# Patient Record
Sex: Female | Born: 1970 | Race: Black or African American | Hispanic: No | Marital: Single
Health system: Southern US, Community
[De-identification: ages and names within clinical notes are randomized; demographics above are authoritative.]

## PROBLEM LIST (undated history)

## (undated) DIAGNOSIS — E7800 Pure hypercholesterolemia, unspecified: Secondary | ICD-10-CM

## (undated) DIAGNOSIS — I10 Essential (primary) hypertension: Secondary | ICD-10-CM

## (undated) DIAGNOSIS — K5900 Constipation, unspecified: Secondary | ICD-10-CM

## (undated) HISTORY — PX: OOPHORECTOMY: SHX86

## (undated) HISTORY — PX: ABDOMINAL HYSTERECTOMY: SHX81

## (undated) HISTORY — PX: CHOLECYSTECTOMY: SHX55

## (undated) HISTORY — PX: ABDOMINAL SURGERY: SHX537

## (undated) HISTORY — DX: Constipation, unspecified: K59.00

## (undated) HISTORY — PX: BONE MARROW BIOPSY: SHX199

## (undated) HISTORY — PX: LAPAROSCOPIC OOPHERECTOMY: SHX6507

---

## 2002-04-24 ENCOUNTER — Encounter: Payer: Self-pay | Admitting: Internal Medicine

## 2002-04-24 ENCOUNTER — Ambulatory Visit (HOSPITAL_COMMUNITY): Admission: RE | Admit: 2002-04-24 | Discharge: 2002-04-24 | Payer: Self-pay | Admitting: Internal Medicine

## 2003-10-29 ENCOUNTER — Ambulatory Visit (HOSPITAL_COMMUNITY): Admission: RE | Admit: 2003-10-29 | Discharge: 2003-10-29 | Payer: Self-pay | Admitting: Internal Medicine

## 2003-11-05 ENCOUNTER — Ambulatory Visit (HOSPITAL_COMMUNITY): Admission: RE | Admit: 2003-11-05 | Discharge: 2003-11-05 | Payer: Self-pay | Admitting: Internal Medicine

## 2003-11-12 ENCOUNTER — Ambulatory Visit (HOSPITAL_COMMUNITY): Admission: RE | Admit: 2003-11-12 | Discharge: 2003-11-12 | Payer: Self-pay | Admitting: General Surgery

## 2004-06-02 ENCOUNTER — Ambulatory Visit (HOSPITAL_COMMUNITY): Admission: RE | Admit: 2004-06-02 | Discharge: 2004-06-02 | Payer: Self-pay | Admitting: General Surgery

## 2004-07-19 ENCOUNTER — Encounter: Admission: RE | Admit: 2004-07-19 | Discharge: 2004-07-19 | Payer: Self-pay | Admitting: Emergency Medicine

## 2004-10-04 ENCOUNTER — Encounter: Admission: RE | Admit: 2004-10-04 | Discharge: 2004-10-04 | Payer: Self-pay | Admitting: Emergency Medicine

## 2004-10-13 ENCOUNTER — Ambulatory Visit: Payer: Self-pay | Admitting: Hematology and Oncology

## 2004-10-21 ENCOUNTER — Other Ambulatory Visit: Admission: RE | Admit: 2004-10-21 | Discharge: 2004-10-21 | Payer: Self-pay | Admitting: Hematology and Oncology

## 2004-10-21 ENCOUNTER — Encounter (INDEPENDENT_AMBULATORY_CARE_PROVIDER_SITE_OTHER): Payer: Self-pay | Admitting: *Deleted

## 2004-11-30 ENCOUNTER — Ambulatory Visit: Payer: Self-pay | Admitting: Hematology and Oncology

## 2004-12-27 ENCOUNTER — Ambulatory Visit: Payer: Self-pay | Admitting: Gastroenterology

## 2004-12-28 ENCOUNTER — Encounter (INDEPENDENT_AMBULATORY_CARE_PROVIDER_SITE_OTHER): Payer: Self-pay | Admitting: *Deleted

## 2004-12-28 ENCOUNTER — Ambulatory Visit: Payer: Self-pay | Admitting: Gastroenterology

## 2005-01-20 ENCOUNTER — Ambulatory Visit: Payer: Self-pay | Admitting: Gastroenterology

## 2005-01-22 ENCOUNTER — Ambulatory Visit: Payer: Self-pay | Admitting: Hematology and Oncology

## 2005-01-27 ENCOUNTER — Ambulatory Visit (HOSPITAL_COMMUNITY): Admission: RE | Admit: 2005-01-27 | Discharge: 2005-01-27 | Payer: Self-pay | Admitting: Gastroenterology

## 2005-02-02 ENCOUNTER — Ambulatory Visit: Payer: Self-pay | Admitting: Gastroenterology

## 2005-03-30 ENCOUNTER — Other Ambulatory Visit: Admission: RE | Admit: 2005-03-30 | Discharge: 2005-03-30 | Payer: Self-pay | Admitting: Gynecology

## 2005-04-27 ENCOUNTER — Ambulatory Visit: Payer: Self-pay | Admitting: Gastroenterology

## 2006-04-26 ENCOUNTER — Other Ambulatory Visit: Admission: RE | Admit: 2006-04-26 | Discharge: 2006-04-26 | Payer: Self-pay | Admitting: Gynecology

## 2007-05-02 ENCOUNTER — Other Ambulatory Visit: Admission: RE | Admit: 2007-05-02 | Discharge: 2007-05-02 | Payer: Self-pay | Admitting: Gynecology

## 2007-12-10 ENCOUNTER — Ambulatory Visit (HOSPITAL_COMMUNITY): Admission: RE | Admit: 2007-12-10 | Discharge: 2007-12-10 | Payer: Self-pay | Admitting: Obstetrics and Gynecology

## 2007-12-10 ENCOUNTER — Encounter (INDEPENDENT_AMBULATORY_CARE_PROVIDER_SITE_OTHER): Payer: Self-pay | Admitting: Obstetrics and Gynecology

## 2008-08-21 ENCOUNTER — Encounter: Admission: RE | Admit: 2008-08-21 | Discharge: 2008-08-21 | Payer: Self-pay | Admitting: Gynecology

## 2010-07-19 NOTE — Op Note (Signed)
Latoya Williams, Latoya Williams NO.:  [id number]   MEDICAL RECORD NO.:  [id number]          PATIENT TYPE:  AMB   LOCATION:  SDC                           FACILITY:  [facility]   PHYSICIAN:  Randye Lobo, M.D.   DATE OF BIRTH:  1970/03/24   DATE OF PROCEDURE:  12/10/2007  DATE OF DISCHARGE:                               OPERATIVE REPORT   PREOPERATIVE DIAGNOSIS:  Chronic right lower quadrant pain.   POSTOPERATIVE DIAGNOSES:  1. Chronic right lower quadrant pain.  2. Pelvic and abdominal adhesions.   PROCEDURE:  Laparoscopic right salpingo-oophorectomy and lysis of pelvic  and abdominal adhesions.   SURGEON:  Randye Lobo, MD   ASSISTANT:  Dollene Primrose. Edward Jolly, [facility]   ANESTHESIA:  General endotracheal.   IV FLUIDS:  1500 mL Ringer lactate.   ESTIMATED BLOOD LOSS:  Minimal.   URINE OUTPUT:  300 mL.   COMPLICATIONS:  None.   INDICATIONS FOR PROCEDURE:  The patient is a 40 year old gravida 2, para  2 African American female with right lower quadrant pain with ovulation  occurring for the last 2 years.  The patient reports that the pain is  debilitating and can last for up to 2 weeks at a time.  The patient has  had a history of a prior total vaginal hysterectomy for abnormal uterine  bleeding in the year 2001.  The patient has had a pelvic ultrasound on  October 04, 2007, documenting bilateral ovaries with multiple small simple  cysts, the largest of which was a 1.1-cm cyst in the right ovary.  There  was no evidence of any free fluid.  The patient did not have any  improvement of her pain with a trial of oral contraceptive therapy.  The  patient is status post colonoscopy, and CT scan of the abdomen and  pelvis, which were unremarkable.  The patient does have a remote history  of a laparoscopic cholecystectomy for a gangrenous gallbladder in 1993.  The patient now presents for surgical evaluation and treatment of her  right lower quadrant pain, and she requests removal of the  right tube  and ovary.  The plan is now made to proceed with a laparoscopic right  salpingo-oophorectomy with anticipated lysis of adhesions after risks,  benefits, and alternatives are reviewed.  The patient understands that  removal of the right tube and ovary is not a guarantee for resolution of  her pain, and she wishes to proceed and is quite insistant about this.   FINDINGS:  Laparoscopy demonstrated adhesive disease of the abdomen and  pelvis.  Specifically, there were adhesions along the left pelvic side  wall.  The left tube and ovary were adherent to the left pelvic side  wall and the rectosigmoid colon was also attached to the left tube and  ovary.  The right tube and ovary were unremarkable expect for the fact  that the right ovary had several small cystic areas.  There was  extensive adhesive disease in the right upper quadrant between the liver  and the abdominal wall and also omentum and the abdominal wall.  These  ended  at the level of the umbilicus.  The appendix was unremarkable.  There was no evidence of any endometriosis in the abdomen or pelvis.  The uterus was surgically absent.   SPECIMENS:  The right tube and ovary were sent to pathology.   PROCEDURE:  The patient was reidentified in the preoperative hold area.  She did receive clindamycin 900 mg IV for antibiotic prophylaxis.  The  patient received TED hose for DVT prophylaxis.   In the operating room, general endotracheal anesthesia was induced, and  the patient was then placed in the dorsal lithotomy position.  The  abdomen and vagina were sterilely prepped and draped.  A Foley catheter  was sterilely placed inside the bladder.  A ring forceps with two  counted and marked sponges was placed in the vagina.   The procedure began by creating a 1-cm umbilical incision with a  scalpel.  Dissection down to the fascia occurred with an Allis clamp.  A  10-mm trocar was then inserted directly into the peritoneal  cavity  without difficulty and the laparoscope confirmed proper placement.  A  CO2 pneumoperitoneum was achieved and the patient was then placed in the  Trendelenburg position.  A 5-mm right and left lower quadrant incisions  were then created with a scalpel and 5-mm trocars were placed through  each of these incisions under direct visualization of the laparoscope.  An inspection of the pelvic and abdominal organs was performed and the  findings were as noted above.   The adhesions along the left pelvic side wall between the rectosigmoid  colon and the left tube and ovary were first addressed by using a  laparoscopic scissors.  Hemostasis was created with the Gyrus  instrument, where necessary.  As the patient was not having any pain on  the left-hand side, no extensive lysis of adhesions of the left tube and  ovary were performed beyond this.   Attention was then turned to the right adnexa region.  The right ureter  was identified along the pelvic side wall.  The tube and ovary were then  elevated, and the Gyrus instrument was used to triple cauterize and then  cut the infundibulopelvic ligament.  Dissection continued through the  broad ligament on that ipsilateral side again using the Gyrus instrument  for cautery and cutting.  The specimen was then completely freed at this  time and was placed in the posterior cul-de-sac.   The 10-mm laparoscope was then converted over to a 5-mm laparoscope,  which was placed through the left lower quadrant incision.  The  EndoCatch bag was placed through the umbilical trocar, and the specimen  was placed in the bag and then removed through the umbilical incision.  This specimen was morcellated to remove it from within the EndoCatch  bag.   The 10-mm trocar was then placed again in the umbilical trocar.  Attention was given to the adhesions, which began at the level of the  umbilicus in the right mid quadrant.  A portion of these adhesions were   lysed as there was a small amount of bleeding along the omentum in this  area, which appeared to have occurred as the umbilical trocar and  laparoscope were examining some of the upper abdominal adhesions.  There  was no extensive bleeding and this area was cauterized easily.  A small  portion of the adhesions were taken down to assure that any cause of the  right lower quadrant pain was relieved.  Hemostasis was  good.   The pelvis was irrigated and suctioned.  There was a small amount of  oozing high along the left pelvic side wall where the rectosigmoid colon  had been previously attached, and this was cauterized briefly with the  Gyrus instrument after it was clear that there were no important  underlying structures in the area.   A small amount of peritoneum overlying the bladder mucosa on the right  hand side was similarly cauterized to what appeared to be oozing  slightly.   Hemostasis was then excellent and the case was concluded.  The 5-mm  lower abdominal trocars were removed under visualization of laparoscope.  The pneumoperitoneum was released and the 10-mm umbilical trocar and  laparoscope were removed simultaneously.   The skin incisions were closed with subcuticular sutures of 3-0 plain-  gut suture.  A simple through-and-through suture was also placed in the  right lower quadrant incision to provide good hemostasis.  Dermabond was  then placed over all the incisions.   The ring forceps and sponges were removed from the vagina and the Foley  catheter was removed as well.   This concluded the patient's procedure.  There were no complications.  Needle, instrument, and sponge counts were correct.  The patient was  discarded to recovery room in stable and awake condition.      Randye Lobo, M.D.  Electronically Signed     BES/MEDQ  D:  12/10/2007  T:  12/11/2007  Job:  [id number]   cc:   Leatha Gilding. Mezer, M.D.  Fax: [phone]

## 2010-07-22 NOTE — H&P (Signed)
NAME:  Latoya Williams, Latoya Williams NO.:  [id number]   MEDICAL RECORD NO.:  [id number]                   PATIENT TYPE:   LOCATION:                                       FACILITY:  [facility]   PHYSICIAN:  Dalia Heading, M.D.               DATE OF BIRTH:  December 19, 1970   DATE OF ADMISSION:  DATE OF DISCHARGE:                                HISTORY & PHYSICAL   CHIEF COMPLAINT:  Intra-abdominal mass.   HISTORY OF PRESENT ILLNESS:  The patient is a 40 year old black female who  is referred for endoscopic evaluation.  She needs a colonoscopy for a  question of an intra-abdominal mass. This was found on CT scan of the  abdomen.  She was found to have a calcified mass along the ascending colon  which was approximately 2 cm in its greatest diameter.  There is a question  if this extrinsic in nature or a diverticulum from the ascending colon.  No  abdominal pain, weight loss, nausea, vomiting, diarrhea, constipation,  melena, or hematochezia have been noted.  She has never had a colonoscopy.  There is no family history of colon carcinoma.   PAST MEDICAL HISTORY:  Unremarkable except for mild anemia.   PAST SURGICAL HISTORY:  Cholelithiasis, partial hysterectomy.   CURRENT MEDICATIONS:  Iron supplements and Nexium.   ALLERGIES:  PENICILLIN.   REVIEW OF SYSTEMS:  Noncontributory.   PHYSICAL EXAMINATION:  GENERAL:  On physical examination, the patient is a  well-developed well-nourished black female in no acute distress.  VITAL SIGNS:  She is afebrile and vital signs are stable.  LUNGS:  Clear to auscultation with equal breath sounds bilaterally.  HEART:  Heart examination reveals a regular rate and rhythm without S3, S4,  or murmurs.  ABDOMEN:  The abdomen is soft, nontender, nondistended.  No  hepatosplenomegaly or masses are noted.  RECTAL:  Examination was deferred to the procedure.   IMPRESSION:  Intra-abdominal mass.   PLAN:  The patient is scheduled for a  colonoscopy on 11/12/2003.  The risks  and benefits of the procedure including bleeding and perforation were fully  explained to the patient, who gave informed consent.  She is getting an  ultrasound of her pelvis later today.     ___________________________________________                                         Dalia Heading, M.D.   MAJ/MEDQ  D:  11/05/2003  T:  11/05/2003  Job:  [id number]   cc:   Dalia Heading, M.D.  [address], [place]  [place] [address]  Fax: [phone]   Madelin Rear. Sherwood Gambler, M.D.  [address]  [place]  [place] [address]  Fax: [phone]

## 2010-12-06 LAB — CBC
HCT: 37.6
Hemoglobin: 12.4
MCV: 84
Platelets: 336
RBC: 4.48
RDW: 13.2

## 2010-12-06 LAB — URINALYSIS, ROUTINE W REFLEX MICROSCOPIC
Hgb urine dipstick: NEGATIVE
Nitrite: NEGATIVE
Urobilinogen, UA: 0.2

## 2014-11-23 ENCOUNTER — Other Ambulatory Visit: Payer: Self-pay | Admitting: Gynecology

## 2014-11-24 LAB — CYTOLOGY - PAP

## 2014-12-10 ENCOUNTER — Encounter: Payer: Self-pay | Admitting: Gastroenterology

## 2015-02-04 ENCOUNTER — Ambulatory Visit: Payer: Self-pay | Admitting: Gastroenterology

## 2015-06-14 ENCOUNTER — Other Ambulatory Visit: Payer: Self-pay | Admitting: Obstetrics and Gynecology

## 2015-06-14 DIAGNOSIS — N644 Mastodynia: Secondary | ICD-10-CM

## 2015-06-17 ENCOUNTER — Other Ambulatory Visit: Payer: Self-pay

## 2015-06-18 ENCOUNTER — Ambulatory Visit
Admission: RE | Admit: 2015-06-18 | Discharge: 2015-06-18 | Disposition: A | Discharge status: Home / Self Care | Payer: BLUE CROSS/BLUE SHIELD | Source: Ambulatory Visit | Attending: Obstetrics and Gynecology | Admitting: Obstetrics and Gynecology

## 2015-06-18 DIAGNOSIS — N644 Mastodynia: Secondary | ICD-10-CM

## 2016-02-14 ENCOUNTER — Emergency Department (HOSPITAL_COMMUNITY): Payer: Commercial Managed Care - HMO

## 2016-02-14 ENCOUNTER — Emergency Department (HOSPITAL_COMMUNITY)
Admission: EM | Admit: 2016-02-14 | Discharge: 2016-02-15 | Disposition: A | Discharge status: Home / Self Care | Payer: Commercial Managed Care - HMO | Attending: Emergency Medicine | Admitting: Emergency Medicine

## 2016-02-14 ENCOUNTER — Encounter (HOSPITAL_COMMUNITY): Payer: Self-pay | Admitting: Emergency Medicine

## 2016-02-14 DIAGNOSIS — R112 Nausea with vomiting, unspecified: Secondary | ICD-10-CM | POA: Diagnosis not present

## 2016-02-14 DIAGNOSIS — R1031 Right lower quadrant pain: Secondary | ICD-10-CM | POA: Insufficient documentation

## 2016-02-14 DIAGNOSIS — I10 Essential (primary) hypertension: Secondary | ICD-10-CM | POA: Insufficient documentation

## 2016-02-14 DIAGNOSIS — R197 Diarrhea, unspecified: Secondary | ICD-10-CM | POA: Insufficient documentation

## 2016-02-14 DIAGNOSIS — Z79899 Other long term (current) drug therapy: Secondary | ICD-10-CM | POA: Insufficient documentation

## 2016-02-14 DIAGNOSIS — R1084 Generalized abdominal pain: Secondary | ICD-10-CM

## 2016-02-14 HISTORY — DX: Essential (primary) hypertension: I10

## 2016-02-14 HISTORY — DX: Pure hypercholesterolemia, unspecified: E78.00

## 2016-02-14 LAB — LIPASE, BLOOD: LIPASE: 17 U/L (ref 11–51)

## 2016-02-14 LAB — CBC
HCT: 39.4 % (ref 36.0–46.0)
HEMOGLOBIN: 12.9 g/dL (ref 12.0–15.0)
MCH: 27.2 pg (ref 26.0–34.0)
MCHC: 32.7 g/dL (ref 30.0–36.0)
MCV: 83.1 fL (ref 78.0–100.0)
Platelets: 337 10*3/uL (ref 150–400)
RBC: 4.74 MIL/uL (ref 3.87–5.11)
RDW: 13.1 % (ref 11.5–15.5)
WBC: 10.2 10*3/uL (ref 4.0–10.5)

## 2016-02-14 LAB — COMPREHENSIVE METABOLIC PANEL
ALBUMIN: 3.6 g/dL (ref 3.5–5.0)
ALT: 13 U/L — AB (ref 14–54)
ANION GAP: 8 (ref 5–15)
AST: 18 U/L (ref 15–41)
Alkaline Phosphatase: 94 U/L (ref 38–126)
BUN: 12 mg/dL (ref 6–20)
CHLORIDE: 106 mmol/L (ref 101–111)
CO2: 24 mmol/L (ref 22–32)
CREATININE: 0.68 mg/dL (ref 0.44–1.00)
Calcium: 8.8 mg/dL — ABNORMAL LOW (ref 8.9–10.3)
GFR calc non Af Amer: >60 mL/min (ref >60)
GLUCOSE: 114 mg/dL — AB (ref 65–99)
Potassium: 3.1 mmol/L — ABNORMAL LOW (ref 3.5–5.1)
SODIUM: 138 mmol/L (ref 135–145)
Total Bilirubin: 0.7 mg/dL (ref 0.3–1.2)
Total Protein: 7.8 g/dL (ref 6.5–8.1)

## 2016-02-14 LAB — URINALYSIS, ROUTINE W REFLEX MICROSCOPIC
Bilirubin Urine: NEGATIVE
GLUCOSE, UA: NEGATIVE mg/dL
HGB URINE DIPSTICK: NEGATIVE
Leukocytes, UA: NEGATIVE
Nitrite: NEGATIVE
Protein, ur: NEGATIVE mg/dL
pH: 6 (ref 5.0–8.0)

## 2016-02-14 MED ORDER — IOPAMIDOL (ISOVUE-300) INJECTION 61%
100.0000 mL | Freq: Once | INTRAVENOUS | Status: AC | PRN
  Administered 2016-02-14: 100 mL via INTRAVENOUS

## 2016-02-14 MED ORDER — ONDANSETRON HCL 4 MG/2ML IJ SOLN
4.0000 mg | Freq: Once | INTRAMUSCULAR | Status: AC
  Administered 2016-02-14: 4 mg via INTRAVENOUS
  Filled 2016-02-14: qty 2

## 2016-02-14 MED ORDER — HYDROCODONE-ACETAMINOPHEN 5-325 MG PO TABS: 1.0000 | ORAL_TABLET | ORAL | 0 refills | Status: DC | PRN

## 2016-02-14 MED ORDER — HYDROMORPHONE HCL 1 MG/ML IJ SOLN
1.0000 mg | Freq: Once | INTRAMUSCULAR | Status: AC
  Administered 2016-02-14: 1 mg via INTRAVENOUS
  Filled 2016-02-14: qty 1

## 2016-02-14 MED ORDER — SODIUM CHLORIDE 0.9 % IV BOLUS (SEPSIS)
1000.0000 mL | Freq: Once | INTRAVENOUS | Status: AC
  Administered 2016-02-14: 1000 mL via INTRAVENOUS

## 2016-02-14 MED ORDER — MORPHINE SULFATE (PF) 4 MG/ML IV SOLN
4.0000 mg | Freq: Once | INTRAVENOUS | Status: AC
  Administered 2016-02-14: 4 mg via INTRAVENOUS
  Filled 2016-02-14: qty 1

## 2016-02-14 MED ORDER — ONDANSETRON 4 MG PO TBDP: 4.0000 mg | ORAL_TABLET | Freq: Three times a day (TID) | ORAL | 0 refills | Status: DC | PRN

## 2016-02-14 NOTE — ED Provider Notes (Signed)
[facility] DEPT Provider Note   CSN: [id number] Arrival date & time: 02/14/16  1919   By signing my name below, I, Latoya Williams, attest that this documentation has been prepared under the direction and in the presence of Isla Pence, MD. Electronically signed, Latoya Williams, ED Scribe. 02/14/16. 11:52 PM.   History   Chief Complaint Chief Complaint  Patient presents with  . Abdominal Pain   The history is provided by the patient and a relative. No language interpreter was used.    HPI Comments: Latoya Williams is a 45 y.o. female who presents to the Emergency Department complaining of constant, mild to moderate abdominal pain since 8:00AM this morning. Pt reports associated subjective fever leading to nausea and vomiting, and diarrhea. She further states that she cannot drink fluid secondary to vomiting. 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 She further states that she has used 2 phenergan suppositories at home with no relief.       Past Medical History:  Diagnosis Date  . Hypercholesterolemia   . Hypertension     There are no active problems to display for this patient.   Past Surgical History:  Procedure Laterality Date  . ABDOMINAL HYSTERECTOMY    . ABDOMINAL SURGERY    . LAPAROSCOPIC OOPHERECTOMY      OB History    Gravida Para Term Preterm AB Living   3 2 2   1      SAB TAB Ectopic Multiple Live Births   1               Home Medications    Prior to Admission medications   Medication Sig Start Date End Date Taking? Authorizing Provider  atorvastatin (LIPITOR) 20 MG tablet TAKE ONE TABLET BY MOUTH ONCE DAILY 01/30/16  Yes Historical Provider, MD  hydrochlorothiazide (HYDRODIURIL) 25 MG tablet TAKE ONE TABLET BY MOUTH ONCE DAILY 02/05/16  Yes Historical Provider, MD  polyethylene glycol powder (GLYCOLAX/MIRALAX) powder MIX 1 CAPFUL WITH 8 OUNCES OF WATER OR JUICE AND DRINK DAILY  01/13/16  Yes Historical Provider, MD  PREMARIN 0.625 MG tablet TAKE ONE TABLET BY MOUTH ONCE DAILY 02/03/16  Yes Historical Provider, MD  Probiotic Product (PROBIOTIC FORMULA PO) Take 1 capsule by mouth daily.   Yes Historical Provider, MD  HYDROcodone-acetaminophen (NORCO/VICODIN) 5-325 MG tablet Take 1 tablet by mouth every 4 (four) hours as needed. 02/14/16   Isla Pence, MD  ondansetron (ZOFRAN ODT) 4 MG disintegrating tablet Take 1 tablet (4 mg total) by mouth every 8 (eight) hours as needed for nausea or vomiting. 02/14/16   Isla Pence, MD    Family History Family History  Problem Relation Age of Onset  . Hypertension Mother   . Hypertension Sister   . Hypertension Brother     Social History Social History  Substance Use Topics  . Smoking status: Never Smoker  . Smokeless tobacco: Never Used  . Alcohol use No     Allergies   Penicillins   Review of Systems Review of Systems  All other systems reviewed and are negative.  A complete 10 system review of systems was obtained and all systems are negative except as noted in the HPI and PMH.    Physical Exam Updated Vital Signs BP 120/70 (BP Location: Left Arm)   Pulse 90   Temp 98.2 F (36.8 C) (Oral)   Resp 18   Ht 5' 1.6" (1.565 m)  Wt 161 lb (73 kg)   SpO2 98%   BMI 29.83 kg/m   Physical Exam  Constitutional: She is oriented to person, place, and time. She appears well-developed and well-nourished.  HENT:  Head: Normocephalic.  Eyes: EOM are normal.  Neck: Normal range of motion.  Pulmonary/Chest: Effort normal.  Abdominal: She exhibits no distension.  Right lower and suprapubic tenderness.   Musculoskeletal: Normal range of motion.  Neurological: She is alert and oriented to person, place, and time.  Skin: Skin is dry.  Mucous membranes dry.   Psychiatric: She has a normal mood and affect.  Nursing note and vitals reviewed.    ED Treatments / Results  DIAGNOSTIC STUDIES: Oxygen  Saturation is 98% on RA, adequate by my interpretation.    COORDINATION OF CARE: 11:52 PM Will order fluids and IV medications for pain and nausea. Discussed treatment plan with pt at bedside and pt agreed to plan.  Labs (all labs ordered are listed, but only abnormal results are displayed) Labs Reviewed  COMPREHENSIVE METABOLIC PANEL - Abnormal; Notable for the following:       Result Value   Potassium 3.1 (*)    Glucose, Bld 114 (*)    Calcium 8.8 (*)    ALT 13 (*)    All other components within normal limits  URINALYSIS, ROUTINE W REFLEX MICROSCOPIC - Abnormal; Notable for the following:    Specific Gravity, Urine >1.030 (*)    Ketones, ur TRACE (*)    All other components within normal limits  LIPASE, BLOOD  CBC    EKG  EKG Interpretation None       Radiology Ct Abdomen Pelvis W Contrast  Result Date: 02/14/2016 CLINICAL DATA:  45 y/o  F; constant mild-to-moderate abdominal pain. EXAM: CT ABDOMEN AND PELVIS WITH CONTRAST TECHNIQUE: Multidetector CT imaging of the abdomen and pelvis was performed using the standard protocol following bolus administration of intravenous contrast. CONTRAST:  148mL ISOVUE-300 IOPAMIDOL (ISOVUE-300) INJECTION 61% COMPARISON:  10/04/2004 CT abdomen and pelvis. FINDINGS: Lower chest: No acute abnormality. Hepatobiliary: No focal liver abnormality is seen. Status post cholecystectomy. Mild intra and extrahepatic biliary ductal dilatation is probably compensatory postcholecystectomy, correlate with liver function tests. Pancreas: Unremarkable. No pancreatic ductal dilatation or surrounding inflammatory changes. Spleen: Normal in size without focal abnormality. Adrenals/Urinary Tract: Adrenal glands are unremarkable. Kidneys are normal, without renal calculi, focal lesion, or hydronephrosis. Bladder is unremarkable. Stomach/Bowel: Stomach is within normal limits. Appendix appears normal. No evidence of bowel wall thickening, distention, or inflammatory  changes. Vascular/Lymphatic: No significant vascular findings are present. No enlarged abdominal or pelvic lymph nodes. Reproductive: Status post hysterectomy and bilateral oophorectomy. Other: No abdominal wall hernia or abnormality. Trace pelvic fluid is likely physiologic. Calcified nodules in the right pericolic gutter are stable and likely represents sequelae of prior infectious/ inflammatory process. Musculoskeletal: No acute or significant osseous findings. IMPRESSION: 1. Mild intra and extrahepatic biliary ductal dilatation is probably compensatory postcholecystectomy. No obstructing stone or lesion identified. Correlation with liver function tests is recommended. 2. Otherwise no acute process of abdomen or pelvis identified. Electronically Signed   By: Kristine Garbe M.D.   On: 02/14/2016 23:41    Procedures Procedures (including critical care time)  Medications Ordered in ED Medications  sodium chloride 0.9 % bolus 1,000 mL (0 mLs Intravenous Stopped 02/14/16 2200)  morphine 4 MG/ML injection 4 mg (4 mg Intravenous Given 02/14/16 2108)  ondansetron (ZOFRAN) injection 4 mg (4 mg Intravenous Given 02/14/16 2108)  ondansetron (ZOFRAN) injection  4 mg (4 mg Intravenous Given 02/14/16 2249)  HYDROmorphone (DILAUDID) injection 1 mg (1 mg Intravenous Given 02/14/16 2249)  iopamidol (ISOVUE-300) 61 % injection 100 mL (100 mLs Intravenous Contrast Given 02/14/16 2312)     Initial Impression / Assessment and Plan / ED Course  I have reviewed the triage vital signs and the nursing notes.  Pertinent labs & imaging results that were available during my care of the patient were reviewed by me and considered in my medical decision making (see chart for details).  Clinical Course     Pt feels much better.  She knows to return if worse.  Final Clinical Impressions(s) / ED Diagnoses   Final diagnoses:  Nausea vomiting and diarrhea  Generalized abdominal pain  I personally performed  the services described in this documentation, which was scribed in my presence. The recorded information has been reviewed and is accurate.   New Prescriptions New Prescriptions   HYDROCODONE-ACETAMINOPHEN (NORCO/VICODIN) 5-325 MG TABLET    Take 1 tablet by mouth every 4 (four) hours as needed.   ONDANSETRON (ZOFRAN ODT) 4 MG DISINTEGRATING TABLET    Take 1 tablet (4 mg total) by mouth every 8 (eight) hours as needed for nausea or vomiting.     Isla Pence, MD 02/14/16 2352

## 2016-02-14 NOTE — ED Triage Notes (Signed)
Pt reports n/v that started this am approx 0800.

## 2016-03-02 ENCOUNTER — Encounter: Payer: Self-pay | Admitting: Gastroenterology

## 2016-03-30 DIAGNOSIS — R112 Nausea with vomiting, unspecified: Secondary | ICD-10-CM | POA: Diagnosis not present

## 2016-04-13 ENCOUNTER — Ambulatory Visit (AMBULATORY_SURGERY_CENTER): Payer: Self-pay

## 2016-04-13 VITALS — Ht 61.5 in | Wt 162.6 lb

## 2016-04-13 DIAGNOSIS — Z1211 Encounter for screening for malignant neoplasm of colon: Secondary | ICD-10-CM

## 2016-04-13 MED ORDER — SUPREP BOWEL PREP KIT 17.5-3.13-1.6 GM/177ML PO SOLN: 1.0000 | Freq: Once | ORAL | 0 refills | Status: AC

## 2016-04-13 NOTE — Progress Notes (Signed)
No allergies to eggs or soy No past problems with anesthesia No diet meds No home oxygen  3rd colonoscopy  Declined emmi

## 2016-04-17 ENCOUNTER — Encounter: Payer: Self-pay | Admitting: Gastroenterology

## 2016-04-28 ENCOUNTER — Encounter: Payer: Self-pay | Admitting: Gastroenterology

## 2016-04-28 ENCOUNTER — Ambulatory Visit (AMBULATORY_SURGERY_CENTER): Payer: Commercial Managed Care - HMO | Admitting: Gastroenterology

## 2016-04-28 ENCOUNTER — Encounter: Payer: Self-pay | Admitting: Family Medicine

## 2016-04-28 VITALS — BP 148/90 | HR 57 | Temp 97.1°F | Resp 12 | Ht 61.5 in | Wt 162.0 lb

## 2016-04-28 DIAGNOSIS — R1030 Lower abdominal pain, unspecified: Secondary | ICD-10-CM | POA: Diagnosis present

## 2016-04-28 DIAGNOSIS — Z1211 Encounter for screening for malignant neoplasm of colon: Secondary | ICD-10-CM

## 2016-04-28 DIAGNOSIS — Z538 Procedure and treatment not carried out for other reasons: Secondary | ICD-10-CM

## 2016-04-28 MED ORDER — SODIUM CHLORIDE 0.9 % IV SOLN: 500.0000 mL | INTRAVENOUS | Status: DC

## 2016-04-28 NOTE — Op Note (Signed)
[facility] Patient Name: Latoya Williams Procedure Date: 04/28/2016 11:06 AM MRN: [id number] Endoscopist: Mauri Pole , MD Age: 46 Referring MD:  Date of Birth: 1970/08/02 Gender: Female Account #: [id number] Procedure:                Colonoscopy Indications:              Last colonoscopy: 2006, Generalized abdominal pain Medicines:                Monitored Anesthesia Care Procedure:                Pre-Anesthesia Assessment:                           - Prior to the procedure, a History and Physical                            was performed, and patient medications and                            allergies were reviewed. The patient's tolerance of                            previous anesthesia was also reviewed. The risks                            and benefits of the procedure and the sedation                            options and risks were discussed with the patient.                            All questions were answered, and informed consent                            was obtained. Prior Anticoagulants: The patient has                            taken no previous anticoagulant or antiplatelet                            agents. ASA Grade Assessment: II - A patient with                            mild systemic disease. After reviewing the risks                            and benefits, the patient was deemed in                            satisfactory condition to undergo the procedure.                           After obtaining informed consent, the colonoscope  was passed under direct vision. Throughout the                            procedure, the patient's blood pressure, pulse, and                            oxygen saturations were monitored continuously. The                            Model CF-HQ190L [id number]) scope was introduced                            through the anus and advanced to the the cecum,   identified by appendiceal orifice and ileocecal                            valve. The colonoscopy was performed without                            difficulty. The patient tolerated the procedure                            well. The quality of the bowel preparation was                            unsatisfactory. The terminal ileum, ileocecal                            valve, appendiceal orifice, and rectum were                            photographed. Scope In: 11:19:50 AM Scope Out: 11:34:28 AM Scope Withdrawal Time: 0 hours 8 minutes 10 seconds  Total Procedure Duration: 0 hours 14 minutes 38 seconds  Findings:                 The perianal and digital rectal examinations were                            normal.                           A diffuse area of moderate melanosis was found in                            the rectum, in the sigmoid colon, in the descending                            colon and in the transverse colon.                           A small amount of bile stained stool was found in                            the entire colon, unable to wash or suction  completely, interfering with visualization of flat                            lesions or small polyps. Complications:            No immediate complications. Estimated Blood Loss:     Estimated blood loss: none. Impression:               - Preparation of the colon was unsatisfactory.                           - Melanosis in the colon.                           - Stool in the entire examined colon.                           - No specimens collected. Recommendation:           - Patient has a contact number available for                            emergencies. The signs and symptoms of potential                            delayed complications were discussed with the                            patient. Return to normal activities tomorrow.                            Written discharge instructions were  provided to the                            patient.                           - Resume previous diet.                           - Continue present medications.                           - Repeat colonoscopy at age 68 for screening                            purposes.                           - Return to GI clinic PRN. Mauri Pole, MD 04/28/2016 11:46:32 AM This report has been signed electronically.

## 2016-04-28 NOTE — Progress Notes (Signed)
Pt's states no medical or surgical changes since previsit or office visit. 

## 2016-04-28 NOTE — Progress Notes (Signed)
Report to PACU, RN, vss, BBS= Clear.  

## 2016-04-28 NOTE — Patient Instructions (Signed)
YOU HAD AN ENDOSCOPIC PROCEDURE TODAY AT [facility]:   Refer to the procedure report that was given to you for any specific questions about what was found during the examination.  If the procedure report does not answer your questions, please call your gastroenterologist to clarify.  If you requested that your care partner not be given the details of your procedure findings, then the procedure report has been included in a sealed envelope for you to review at your convenience later.  YOU SHOULD EXPECT: Some feelings of bloating in the abdomen. Passage of more gas than usual.  Walking can help get rid of the air that was put into your GI tract during the procedure and reduce the bloating. If you had a lower endoscopy (such as a colonoscopy or flexible sigmoidoscopy) you may notice spotting of blood in your stool or on the toilet paper. If you underwent a bowel prep for your procedure, you may not have a normal bowel movement for a few days.  Please Note:  You might notice some irritation and congestion in your nose or some drainage.  This is from the oxygen used during your procedure.  There is no need for concern and it should clear up in a day or so.  SYMPTOMS TO REPORT IMMEDIATELY:   Following lower endoscopy (colonoscopy or flexible sigmoidoscopy):  Excessive amounts of blood in the stool  Significant tenderness or worsening of abdominal pains  Swelling of the abdomen that is new, acute  Fever of 100F or higher   For urgent or emergent issues, a gastroenterologist can be reached at any hour by calling [phone].   DIET:  We do recommend a small meal at first, but then you may proceed to your regular diet.  Drink plenty of fluids but you should avoid alcoholic beverages for 24 hours.  ACTIVITY:  You should plan to take it easy for the rest of today and you should NOT DRIVE or use heavy machinery until tomorrow (because of the sedation medicines used during the test).     FOLLOW UP: Our staff will call the number listed on your records the next business day following your procedure to check on you and address any questions or concerns that you may have regarding the information given to you following your procedure. If we do not reach you, we will leave a message.  However, if you are feeling well and you are not experiencing any problems, there is no need to return our call.  We will assume that you have returned to your regular daily activities without incident.  If any biopsies were taken you will be contacted by phone or by letter within the next 1-3 weeks.  Please call us at [phone] if you have not heard about the biopsies in 3 weeks.   Repeat Colonoscopy at age of 54 for screening purposes Return to GI clinic as needed   SIGNATURES/CONFIDENTIALITY: You and/or your care partner have signed paperwork which will be entered into your electronic medical record.  These signatures attest to the fact that that the information above on your After Visit Summary has been reviewed and is understood.  Full responsibility of the confidentiality of this discharge information lies with you and/or your care-partner.

## 2016-05-01 ENCOUNTER — Telehealth: Payer: Self-pay | Admitting: *Deleted

## 2016-05-01 NOTE — Telephone Encounter (Signed)
  Follow up Call-  Call back number 04/28/2016  Post procedure Call Back phone  # [phone] [place] [phone]  Permission to leave phone message Yes  Some recent data might be hidden    [facility]

## 2016-05-01 NOTE — Telephone Encounter (Signed)
No answer and no voicemail to leave message will attempt to call back later this afternoon. SM

## 2016-06-05 DIAGNOSIS — J329 Chronic sinusitis, unspecified: Secondary | ICD-10-CM | POA: Diagnosis not present

## 2016-07-12 DIAGNOSIS — I10 Essential (primary) hypertension: Secondary | ICD-10-CM | POA: Diagnosis not present

## 2016-07-12 DIAGNOSIS — E782 Mixed hyperlipidemia: Secondary | ICD-10-CM | POA: Diagnosis not present

## 2016-07-13 DIAGNOSIS — I10 Essential (primary) hypertension: Secondary | ICD-10-CM | POA: Diagnosis not present

## 2016-07-13 DIAGNOSIS — Z0000 Encounter for general adult medical examination without abnormal findings: Secondary | ICD-10-CM | POA: Diagnosis not present

## 2016-07-13 DIAGNOSIS — N951 Menopausal and female climacteric states: Secondary | ICD-10-CM | POA: Diagnosis not present

## 2016-11-28 DIAGNOSIS — E782 Mixed hyperlipidemia: Secondary | ICD-10-CM | POA: Diagnosis not present

## 2016-11-28 DIAGNOSIS — I10 Essential (primary) hypertension: Secondary | ICD-10-CM | POA: Diagnosis not present

## 2017-02-01 DIAGNOSIS — M5412 Radiculopathy, cervical region: Secondary | ICD-10-CM | POA: Diagnosis not present

## 2017-02-13 DIAGNOSIS — Z0000 Encounter for general adult medical examination without abnormal findings: Secondary | ICD-10-CM | POA: Diagnosis not present

## 2017-02-13 DIAGNOSIS — I10 Essential (primary) hypertension: Secondary | ICD-10-CM | POA: Diagnosis not present

## 2017-02-13 DIAGNOSIS — N951 Menopausal and female climacteric states: Secondary | ICD-10-CM | POA: Diagnosis not present

## 2017-03-01 DIAGNOSIS — E782 Mixed hyperlipidemia: Secondary | ICD-10-CM | POA: Diagnosis not present

## 2017-03-01 DIAGNOSIS — I10 Essential (primary) hypertension: Secondary | ICD-10-CM | POA: Diagnosis not present

## 2017-06-06 DIAGNOSIS — J329 Chronic sinusitis, unspecified: Secondary | ICD-10-CM | POA: Diagnosis not present

## 2017-06-26 DIAGNOSIS — Z0000 Encounter for general adult medical examination without abnormal findings: Secondary | ICD-10-CM | POA: Diagnosis not present

## 2017-06-26 DIAGNOSIS — I10 Essential (primary) hypertension: Secondary | ICD-10-CM | POA: Diagnosis not present

## 2017-06-26 DIAGNOSIS — N951 Menopausal and female climacteric states: Secondary | ICD-10-CM | POA: Diagnosis not present

## 2017-06-28 DIAGNOSIS — Z0000 Encounter for general adult medical examination without abnormal findings: Secondary | ICD-10-CM | POA: Diagnosis not present

## 2017-10-01 DIAGNOSIS — N951 Menopausal and female climacteric states: Secondary | ICD-10-CM | POA: Diagnosis not present

## 2017-10-01 DIAGNOSIS — Z0000 Encounter for general adult medical examination without abnormal findings: Secondary | ICD-10-CM | POA: Diagnosis not present

## 2017-10-01 DIAGNOSIS — I10 Essential (primary) hypertension: Secondary | ICD-10-CM | POA: Diagnosis not present

## 2017-10-03 DIAGNOSIS — N951 Menopausal and female climacteric states: Secondary | ICD-10-CM | POA: Diagnosis not present

## 2017-10-03 DIAGNOSIS — I10 Essential (primary) hypertension: Secondary | ICD-10-CM | POA: Diagnosis not present

## 2018-01-23 DIAGNOSIS — L509 Urticaria, unspecified: Secondary | ICD-10-CM | POA: Diagnosis not present

## 2018-01-28 IMAGING — CT CT ABD-PELV W/ CM
2 of 5 series · 16 of 46 positions shown, 18 images · IV contrast (Isovue)
Comparison: 10/04/2004 CT abdomen and pelvis.

CLINICAL DATA: 45 y/o  F; constant mild-to-moderate abdominal pain.

EXAM:
CT ABDOMEN AND PELVIS WITH CONTRAST
TECHNIQUE: Multidetector CT imaging of the abdomen and pelvis was performed
using the standard protocol following bolus administration of
intravenous contrast.
CONTRAST:  100mL BEHJB2-5XX IOPAMIDOL (BEHJB2-5XX) INJECTION 61%

[Series 2: axial st · axial · 0.68mm/px · z∈[+578,+984]mm · 13 of 93 slices shown, 15 images]
[im 6/93  soft-tissue]
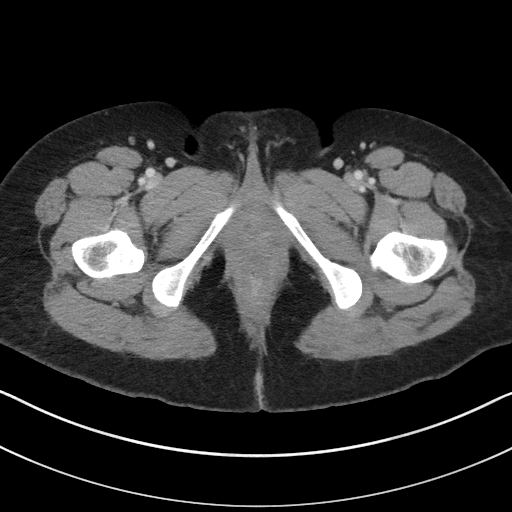
[im 6/93  bone]
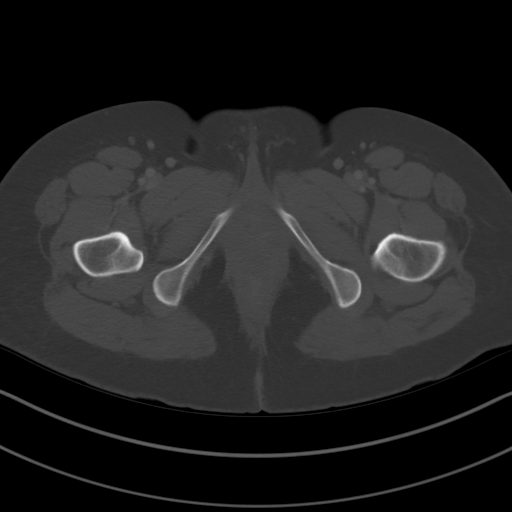
[im 11/93  soft-tissue]
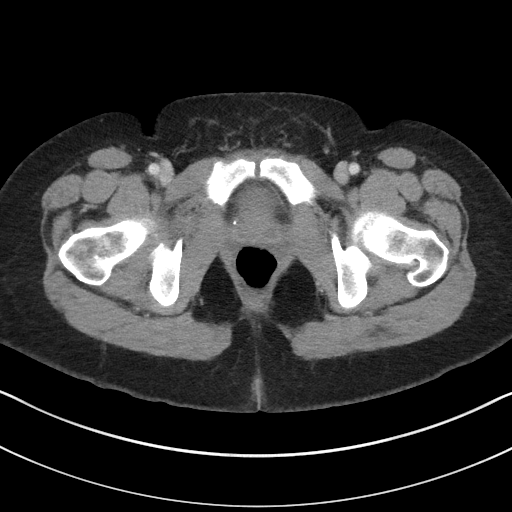
[im 21/93  soft-tissue]
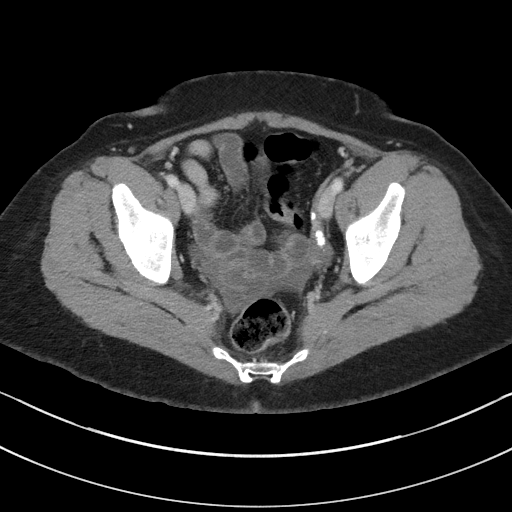
[im 26/93  soft-tissue]
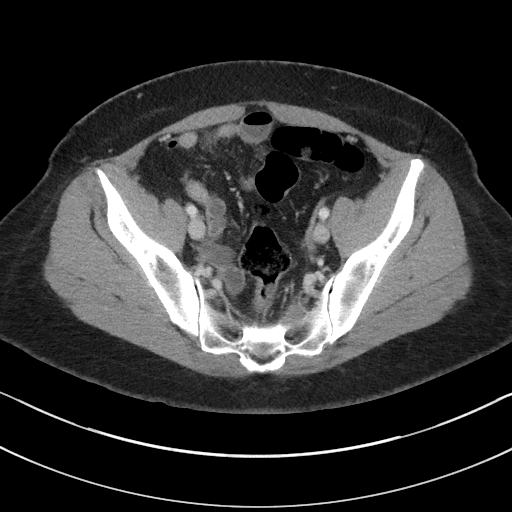
[im 31/93  soft-tissue]
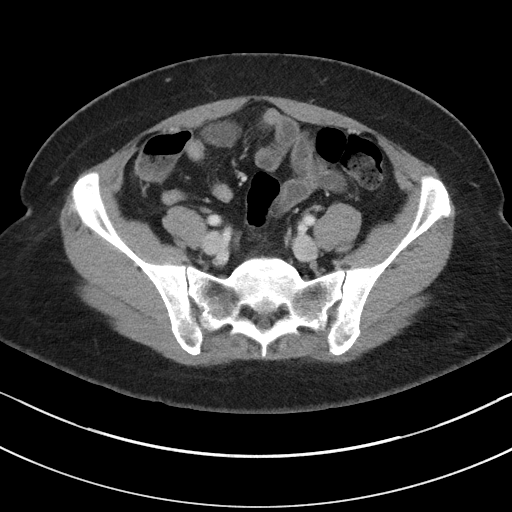
[im 41/93  soft-tissue]
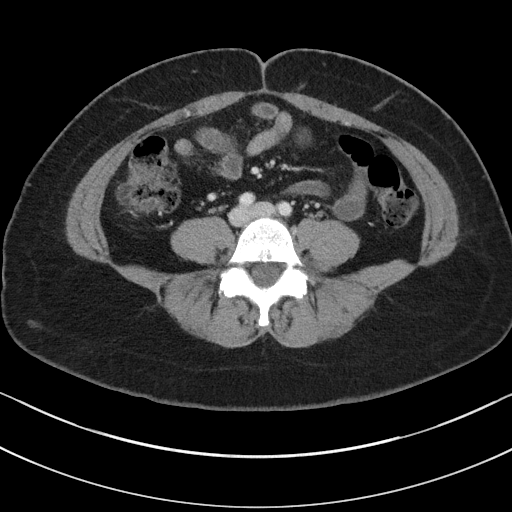
[im 47/93  soft-tissue]
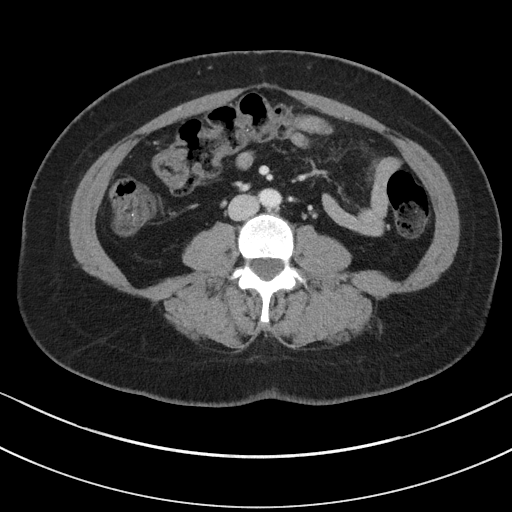
[im 52/93  soft-tissue]
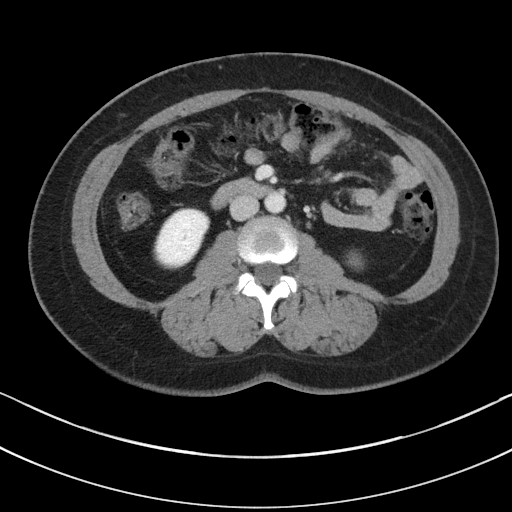
[im 62/93  soft-tissue]
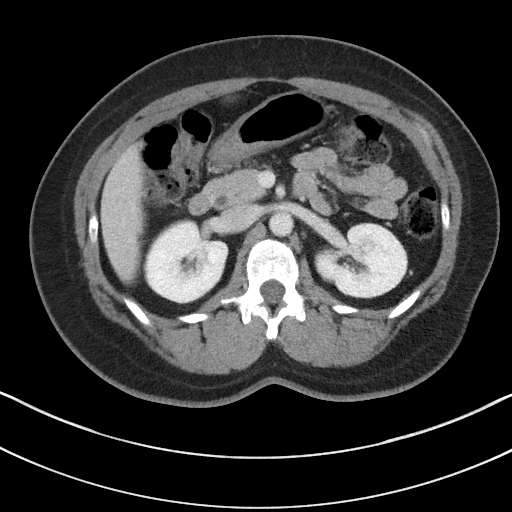
[im 62/93  bone]
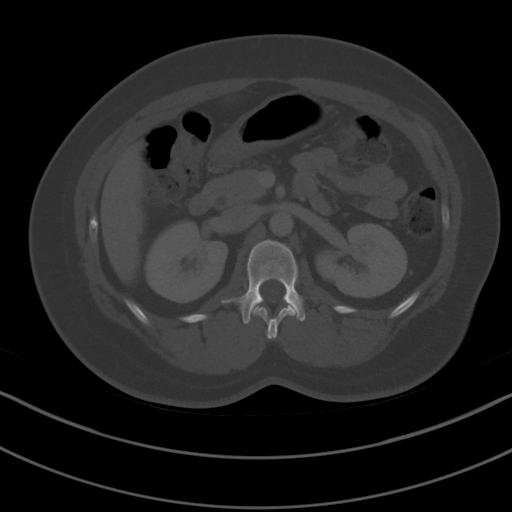
[im 67/93  soft-tissue]
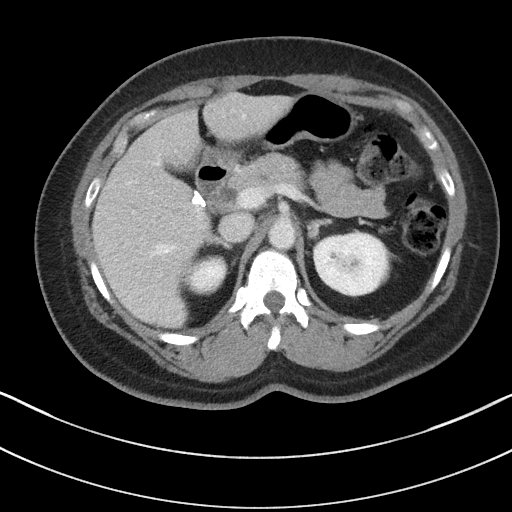
[im 72/93  soft-tissue]
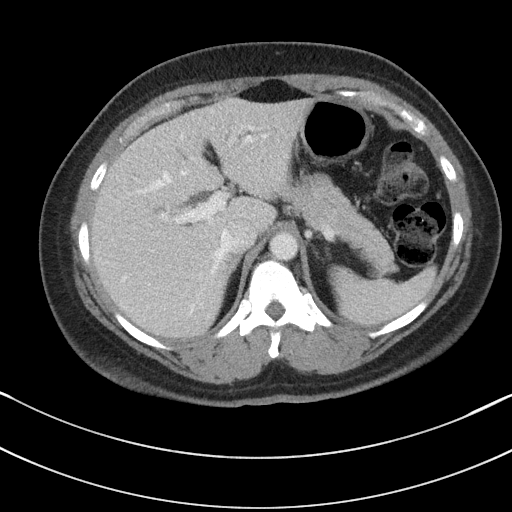
[im 82/93  soft-tissue]
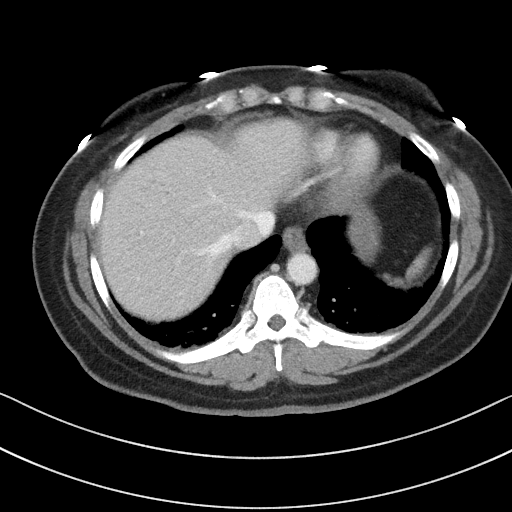
[im 87/93  soft-tissue]
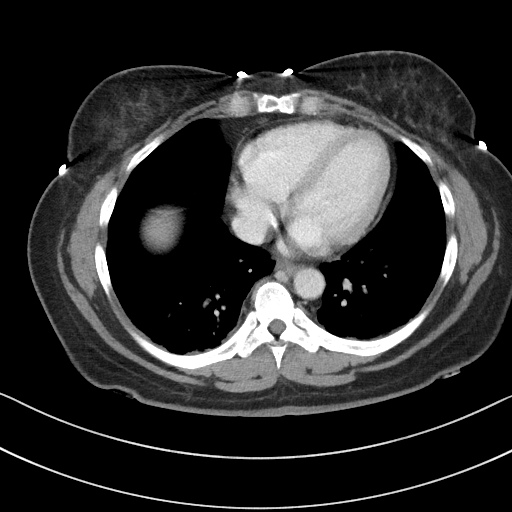

[Series 4: coronal st · coronal · 0.80mm/px · 3 of 81 slices shown]
[im 27/81  soft-tissue]
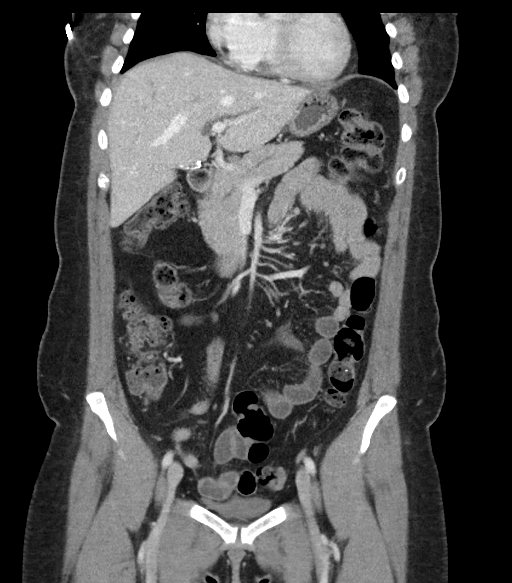
[im 36/81  soft-tissue]
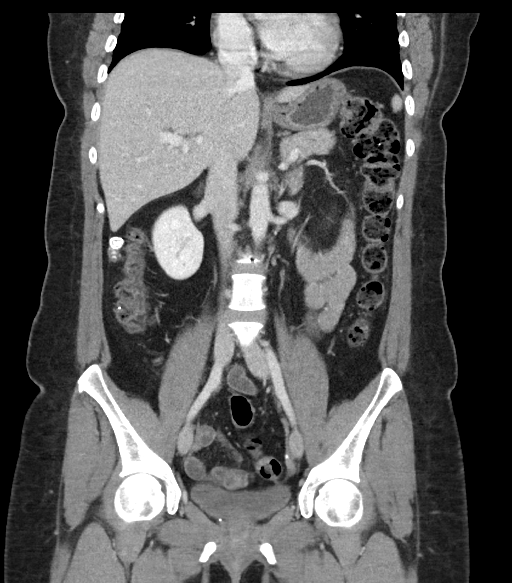
[im 45/81  soft-tissue]
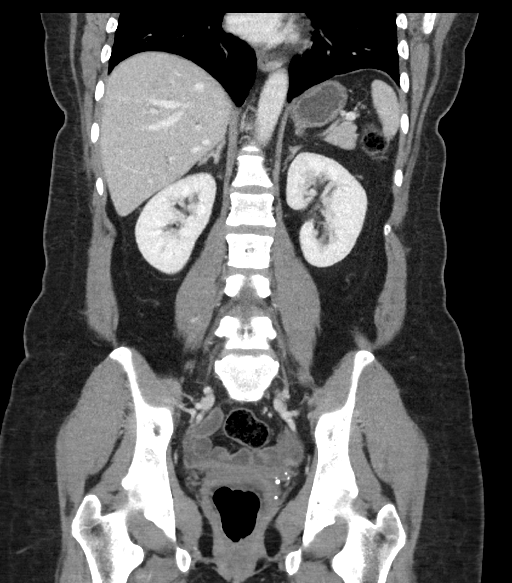

[16 of 46 positions shown; findings below may reference images not displayed]

FINDINGS: Lower chest: No acute abnormality.

Hepatobiliary: No focal liver abnormality is seen. Status post
cholecystectomy. Mild intra and extrahepatic biliary ductal
dilatation is probably compensatory postcholecystectomy, correlate
with liver function tests.

Pancreas: Unremarkable. No pancreatic ductal dilatation or
surrounding inflammatory changes.

Spleen: Normal in size without focal abnormality.

Adrenals/Urinary Tract: Adrenal glands are unremarkable. Kidneys are
normal, without renal calculi, focal lesion, or hydronephrosis.
Bladder is unremarkable.

Stomach/Bowel: Stomach is within normal limits. Appendix appears
normal. No evidence of bowel wall thickening, distention, or
inflammatory changes.

Vascular/Lymphatic: No significant vascular findings are present. No
enlarged abdominal or pelvic lymph nodes.

Reproductive: Status post hysterectomy and bilateral oophorectomy.

Other: No abdominal wall hernia or abnormality. Trace pelvic fluid
is likely physiologic. Calcified nodules in the right pericolic
gutter are stable and likely represents sequelae of prior
infectious/ inflammatory process.

Musculoskeletal: No acute or significant osseous findings.
IMPRESSION: 1. Mild intra and extrahepatic biliary ductal dilatation is probably
compensatory postcholecystectomy. No obstructing stone or lesion
identified. Correlation with liver function tests is recommended.
2. Otherwise no acute process of abdomen or pelvis identified.

By: Yudi Hoelscher M.D.

## 2018-03-17 ENCOUNTER — Encounter (HOSPITAL_COMMUNITY): Payer: Self-pay | Admitting: *Deleted

## 2018-03-17 ENCOUNTER — Other Ambulatory Visit: Payer: Self-pay

## 2018-03-17 ENCOUNTER — Emergency Department (HOSPITAL_COMMUNITY)
Admission: EM | Admit: 2018-03-17 | Discharge: 2018-03-17 | Disposition: A | Discharge status: Home / Self Care | Payer: 59 | Attending: Emergency Medicine | Admitting: Emergency Medicine

## 2018-03-17 DIAGNOSIS — R112 Nausea with vomiting, unspecified: Principal | ICD-10-CM

## 2018-03-17 DIAGNOSIS — R197 Diarrhea, unspecified: Secondary | ICD-10-CM

## 2018-03-17 DIAGNOSIS — R1084 Generalized abdominal pain: Secondary | ICD-10-CM

## 2018-03-17 DIAGNOSIS — R6883 Chills (without fever): Secondary | ICD-10-CM

## 2018-03-17 DIAGNOSIS — I10 Essential (primary) hypertension: Secondary | ICD-10-CM

## 2018-03-17 HISTORY — DX: Pure hypercholesterolemia, unspecified: E78.00

## 2018-03-17 HISTORY — DX: Essential (primary) hypertension: I10

## 2018-03-17 MED ORDER — ONDANSETRON 8 MG PO TBDP: 8.0000 mg | ORAL_TABLET | Freq: Three times a day (TID) | ORAL | 0 refills | Status: DC | PRN

## 2018-03-17 MED ORDER — FENTANYL CITRATE (PF) 100 MCG/2ML IJ SOLN
50.0000 ug | Freq: Once | INTRAMUSCULAR | Status: AC
  Administered 2018-03-17: 50 ug via INTRAVENOUS
  Filled 2018-03-17: qty 2

## 2018-03-17 MED ORDER — KETOROLAC TROMETHAMINE 30 MG/ML IJ SOLN
30.0000 mg | Freq: Once | INTRAMUSCULAR | Status: AC
  Administered 2018-03-17: 30 mg via INTRAVENOUS
  Filled 2018-03-17: qty 1

## 2018-03-17 MED ORDER — ONDANSETRON HCL 4 MG/2ML IJ SOLN
4.0000 mg | Freq: Once | INTRAMUSCULAR | Status: AC
  Administered 2018-03-17: 4 mg via INTRAVENOUS
  Filled 2018-03-17: qty 2

## 2018-03-17 MED ORDER — ONDANSETRON 8 MG PO TBDP
8.0000 mg | ORAL_TABLET | Freq: Once | ORAL | Status: DC
  Filled 2018-03-17: qty 1

## 2018-03-17 MED ORDER — SODIUM CHLORIDE 0.9 % IV BOLUS (SEPSIS)
2000.0000 mL | Freq: Once | INTRAVENOUS | Status: AC
  Administered 2018-03-17: 2000 mL via INTRAVENOUS

## 2018-03-17 NOTE — ED Triage Notes (Signed)
Pt c/o n/v/d, abd pain with chills since yesterday,

## 2018-03-17 NOTE — ED Notes (Signed)
Pt given sprite to drink. 

## 2018-03-17 NOTE — Discharge Instructions (Addendum)

## 2018-03-17 NOTE — ED Provider Notes (Signed)
[facility] EMERGENCY DEPARTMENT Provider Note   CSN: [id number] Arrival date & time: 03/17/18  0028     History   Chief Complaint Chief Complaint  Patient presents with  . Abdominal Pain    HPI Latoya Williams is a 48 y.o. female.  The history is provided by the patient.  Abdominal Pain  Pain location:  Generalized Pain quality: cramping   Pain radiates to:  Does not radiate Pain severity:  Moderate Onset quality:  Gradual Timing:  Intermittent Progression:  Worsening Chronicity:  New Relieved by:  Bowel activity and vomiting Worsened by:  Nothing Associated symptoms: chills, nausea and vomiting   Associated symptoms: no cough, no fever, no hematemesis and no hematochezia   Patient with history of hypertension hyperlipidemia presents with abdominal pain/vomiting/diarrhea.  She reports began with vomiting now having episodes of diarrhea.  She also reports generalized abdominal cramping that is relieved with bowel activity.  No fevers.  No bloody stools.  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  No recent travel.  Past Medical History:  Diagnosis Date  . High cholesterol   . Hypertension     There are no active problems to display for this patient.   Past Surgical History:  Procedure Laterality Date  . ABDOMINAL HYSTERECTOMY    . CHOLECYSTECTOMY    . OOPHORECTOMY       OB History   No obstetric history on file.      Home Medications    Prior to Admission medications   Not on File    Family History No family history on file.  Social History Social History   Tobacco Use  . Smoking status: Never Smoker  . Smokeless tobacco: Never Used  Substance Use Topics  . Alcohol use: Never    Frequency: Never  . Drug use: Never     Allergies   Penicillins   Review of Systems Review of Systems  Constitutional: Positive for chills. Negative for fever.  Respiratory: Negative for cough.   Gastrointestinal: Positive for abdominal pain,  nausea and vomiting. Negative for blood in stool, hematemesis and hematochezia.  All other systems reviewed and are negative.    Physical Exam Updated Vital Signs BP 132/87   Pulse 89   Temp 98.3 F (36.8 C) (Oral)   Resp 16   Ht 1.562 m (5' 1.5")   Wt 76.2 kg   SpO2 94%   BMI 31.23 kg/m   Physical Exam CONSTITUTIONAL: Well developed/well nourished HEAD: Normocephalic/atraumatic EYES: EOMI/PERRL, no icterus ENMT: Mucous membranes dry NECK: supple no meningeal signs SPINE/BACK:entire spine nontender CV: S1/S2 noted, no murmurs/rubs/gallops noted LUNGS: Lungs are clear to auscultation bilaterally, no apparent distress ABDOMEN: soft, nontender, no rebound or guarding, bowel sounds noted throughout abdomen GU:no cva tenderness NEURO: Pt is awake/alert/appropriate, moves all extremitiesx4.  No facial droop.   EXTREMITIES: pulses normal/equal, full ROM SKIN: warm, color normal PSYCH: no abnormalities of mood noted, alert and oriented to situation   ED Treatments / Results  Labs (all labs ordered are listed, but only abnormal results are displayed) Labs Reviewed - No data to display  EKG None  Radiology No results found.  Procedures Procedures (including critical care time)  Medications Ordered in ED Medications  ondansetron (ZOFRAN) injection 4 mg (4 mg Intravenous Given 03/17/18 0126)  ketorolac (TORADOL) 30 MG/ML injection 30 mg (30 mg Intravenous Given 03/17/18 0126)  sodium chloride 0.9 % bolus 2,000 mL (2,000 mLs Intravenous New Bag/Given 03/17/18 0125)  fentaNYL (  SUBLIMAZE) injection 50 mcg (50 mcg Intravenous Given 03/17/18 0212)     Initial Impression / Assessment and Plan / ED Course  I have reviewed the triage vital signs and the nursing notes.      1:23 AM Pt with a probable viral illness.  IV fluids and nausea medicines have  been ordered. 2:48 AM Patient improved.  She took a whole can of Sprite.  No vomiting.  Pain improved.  No focal abdominal  tenderness.  Suspect viral illness causing vomiting/diarrhea will discharge home Final Clinical Impressions(s) / ED Diagnoses   Final diagnoses:  Nausea vomiting and diarrhea  Generalized abdominal pain    ED Discharge Orders         Ordered    ondansetron (ZOFRAN ODT) 8 MG disintegrating tablet  Every 8 hours PRN     03/17/18 0245           Ripley Fraise, MD 03/17/18 [phone]

## 2018-04-01 DIAGNOSIS — N951 Menopausal and female climacteric states: Secondary | ICD-10-CM | POA: Diagnosis not present

## 2018-04-01 DIAGNOSIS — I10 Essential (primary) hypertension: Secondary | ICD-10-CM | POA: Diagnosis not present

## 2018-04-02 DIAGNOSIS — I10 Essential (primary) hypertension: Secondary | ICD-10-CM | POA: Diagnosis not present

## 2018-04-02 DIAGNOSIS — Z0000 Encounter for general adult medical examination without abnormal findings: Secondary | ICD-10-CM | POA: Diagnosis not present

## 2018-04-02 DIAGNOSIS — N951 Menopausal and female climacteric states: Secondary | ICD-10-CM | POA: Diagnosis not present

## 2018-11-26 ENCOUNTER — Encounter: Payer: Self-pay | Admitting: Gastroenterology

## 2019-04-22 ENCOUNTER — Encounter: Payer: Self-pay | Admitting: Gastroenterology

## 2019-05-01 DIAGNOSIS — I10 Essential (primary) hypertension: Secondary | ICD-10-CM | POA: Insufficient documentation

## 2019-05-01 DIAGNOSIS — E7800 Pure hypercholesterolemia, unspecified: Secondary | ICD-10-CM | POA: Insufficient documentation

## 2019-05-08 ENCOUNTER — Ambulatory Visit (AMBULATORY_SURGERY_CENTER): Payer: Self-pay | Admitting: *Deleted

## 2019-05-08 ENCOUNTER — Other Ambulatory Visit: Payer: Self-pay

## 2019-05-08 VITALS — Temp 96.8°F | Ht 61.5 in | Wt 171.0 lb

## 2019-05-08 DIAGNOSIS — E7800 Pure hypercholesterolemia, unspecified: Secondary | ICD-10-CM

## 2019-05-08 DIAGNOSIS — Z1211 Encounter for screening for malignant neoplasm of colon: Secondary | ICD-10-CM

## 2019-05-08 DIAGNOSIS — Z01818 Encounter for other preprocedural examination: Secondary | ICD-10-CM

## 2019-05-08 MED ORDER — SUPREP BOWEL PREP KIT 17.5-3.13-1.6 GM/177ML PO SOLN: 1.0000 | Freq: Once | ORAL | 0 refills | Status: AC

## 2019-05-08 NOTE — Progress Notes (Signed)
No egg or soy allergy known to patient  No issues with past sedation with any surgeries  or procedures, no intubation problems  No diet pills per patient No home 02 use per patient  No blood thinners per patient  Pt denies issues with constipation-- she is currently on Linzess daily- she states she has soft regular DAILY BM;s on linzess - some days two good soft BM's- did not do 2 day prep due to no current issues with constipation per pt  No A fib or A flutter  EMMI video sent to pt's e mail   Pt to hold her Phentermine 10 days prior to her colon 3-18   Due to the COVID-19 pandemic we are asking patients to follow these guidelines. Please only bring one care partner. Please be aware that your care partner may wait in the car in the parking lot or if they feel like they will be too hot to wait in the car, they may wait in the lobby on the 4th floor. All care partners are required to wear a mask the entire time (we do not have any that we can provide them), they need to practice social distancing, and we will do a Covid check for all patient's and care partners when you arrive. Also we will check their temperature and your temperature. If the care partner waits in their car they need to stay in the parking lot the entire time and we will call them on their cell phone when the patient is ready for discharge so they can bring the car to the front of the building. Also all patient's will need to wear a mask into building. suprep code to [facility] and [facility] $15 coupon to pt

## 2019-05-09 ENCOUNTER — Telehealth: Payer: Self-pay | Admitting: Gastroenterology

## 2019-05-09 NOTE — Telephone Encounter (Signed)
Pt needs later appt that day- covid test changed to 3:55pm on 05-20-19

## 2019-05-09 NOTE — Telephone Encounter (Signed)
Pt would like to reschedule covid test to a later time.

## 2019-05-20 ENCOUNTER — Other Ambulatory Visit (HOSPITAL_COMMUNITY): Payer: Commercial Managed Care - PPO

## 2019-05-20 ENCOUNTER — Other Ambulatory Visit: Payer: Self-pay

## 2019-05-20 ENCOUNTER — Encounter: Payer: Self-pay | Admitting: Gastroenterology

## 2019-05-20 ENCOUNTER — Other Ambulatory Visit (HOSPITAL_COMMUNITY)
Admission: RE | Admit: 2019-05-20 | Discharge: 2019-05-20 | Disposition: A | Discharge status: Home / Self Care | Payer: Commercial Managed Care - PPO | Source: Ambulatory Visit | Attending: Gastroenterology | Admitting: Gastroenterology

## 2019-05-20 DIAGNOSIS — Z01812 Encounter for preprocedural laboratory examination: Secondary | ICD-10-CM | POA: Diagnosis present

## 2019-05-20 DIAGNOSIS — Z20822 Contact with and (suspected) exposure to covid-19: Secondary | ICD-10-CM | POA: Diagnosis not present

## 2019-05-20 LAB — SARS CORONAVIRUS 2 (TAT 6-24 HRS): SARS Coronavirus 2: NEGATIVE

## 2019-05-22 ENCOUNTER — Other Ambulatory Visit: Payer: Self-pay

## 2019-05-22 ENCOUNTER — Encounter: Payer: Self-pay | Admitting: Gastroenterology

## 2019-05-22 ENCOUNTER — Ambulatory Visit (AMBULATORY_SURGERY_CENTER): Payer: Commercial Managed Care - PPO | Admitting: Gastroenterology

## 2019-05-22 VITALS — BP 152/94 | HR 60 | Temp 97.5°F | Resp 20 | Ht 61.0 in | Wt 171.0 lb

## 2019-05-22 DIAGNOSIS — D122 Benign neoplasm of ascending colon: Secondary | ICD-10-CM

## 2019-05-22 DIAGNOSIS — D128 Benign neoplasm of rectum: Secondary | ICD-10-CM | POA: Diagnosis not present

## 2019-05-22 DIAGNOSIS — Z1211 Encounter for screening for malignant neoplasm of colon: Secondary | ICD-10-CM

## 2019-05-22 MED ORDER — SODIUM CHLORIDE 0.9 % IV SOLN: 500.0000 mL | Freq: Once | INTRAVENOUS | Status: DC

## 2019-05-22 NOTE — Patient Instructions (Signed)
Please read handouts provided. Continue present medications. Await pathology results.   YOU HAD AN ENDOSCOPIC PROCEDURE TODAY AT THE [facility]:   Refer to the procedure report that was given to you for any specific questions about what was found during the examination.  If the procedure report does not answer your questions, please call your gastroenterologist to clarify.  If you requested that your care partner not be given the details of your procedure findings, then the procedure report has been included in a sealed envelope for you to review at your convenience later.  YOU SHOULD EXPECT: Some feelings of bloating in the abdomen. Passage of more gas than usual.  Walking can help get rid of the air that was put into your GI tract during the procedure and reduce the bloating. If you had a lower endoscopy (such as a colonoscopy or flexible sigmoidoscopy) you may notice spotting of blood in your stool or on the toilet paper. If you underwent a bowel prep for your procedure, you may not have a normal bowel movement for a few days.  Please Note:  You might notice some irritation and congestion in your nose or some drainage.  This is from the oxygen used during your procedure.  There is no need for concern and it should clear up in a day or so.  SYMPTOMS TO REPORT IMMEDIATELY:  Following lower endoscopy (colonoscopy or flexible sigmoidoscopy):  Excessive amounts of blood in the stool  Significant tenderness or worsening of abdominal pains  Swelling of the abdomen that is new, acute  Fever of 100F or higher   For urgent or emergent issues, a gastroenterologist can be reached at any hour by calling [phone]. Do not use MyChart messaging for urgent concerns.    DIET:  We do recommend a small meal at first, but then you may proceed to your regular diet.  Drink plenty of fluids but you should avoid alcoholic beverages for 24 hours.  ACTIVITY:  You should plan to take it easy  for the rest of today and you should NOT DRIVE or use heavy machinery until tomorrow (because of the sedation medicines used during the test).    FOLLOW UP: Our staff will call the number listed on your records 48-72 hours following your procedure to check on you and address any questions or concerns that you may have regarding the information given to you following your procedure. If we do not reach you, we will leave a message.  We will attempt to reach you two times.  During this call, we will ask if you have developed any symptoms of COVID 19. If you develop any symptoms (ie: fever, flu-like symptoms, shortness of breath, cough etc.) before then, please call [phone].  If you test positive for Covid 19 in the 2 weeks post procedure, please call and report this information to us.    If any biopsies were taken you will be contacted by phone or by letter within the next 1-3 weeks.  Please call us at [phone] if you have not heard about the biopsies in 3 weeks.    SIGNATURES/CONFIDENTIALITY: You and/or your care partner have signed paperwork which will be entered into your electronic medical record.  These signatures attest to the fact that that the information above on your After Visit Summary has been reviewed and is understood.  Full responsibility of the confidentiality of this discharge information lies with you and/or your care-partner.  

## 2019-05-22 NOTE — Progress Notes (Signed)
Temp check by:JB Vital check by:CW  The patient states no changes in medical or surgical history since pre-visit screening on 05/08/19.

## 2019-05-22 NOTE — Op Note (Signed)
[place] Patient Name: Latoya Williams Procedure Date: 05/22/2019 10:24 AM MRN: [id number] Endoscopist: Mauri Pole , MD Age: 49 Referring MD:  Date of Birth: 1971-01-10 Gender: Female Account #: [id number] Procedure:                Colonoscopy Indications:              Screening for colorectal malignant neoplasm Medicines:                Monitored Anesthesia Care Procedure:                Pre-Anesthesia Assessment:                           - Prior to the procedure, a History and Physical                            was performed, and patient medications and                            allergies were reviewed. The patient's tolerance of                            previous anesthesia was also reviewed. The risks                            and benefits of the procedure and the sedation                            options and risks were discussed with the patient.                            All questions were answered, and informed consent                            was obtained. Prior Anticoagulants: The patient has                            taken no previous anticoagulant or antiplatelet                            agents. ASA Grade Assessment: II - A patient with                            mild systemic disease. After reviewing the risks                            and benefits, the patient was deemed in                            satisfactory condition to undergo the procedure.                           After obtaining informed consent, the colonoscope  was passed under direct vision. Throughout the                            procedure, the patient's blood pressure, pulse, and                            oxygen saturations were monitored continuously. The                            Colonoscope was introduced through the anus and                            advanced to the the cecum, identified by                            appendiceal  orifice and ileocecal valve. The                            colonoscopy was performed without difficulty. The                            patient tolerated the procedure well. The quality                            of the bowel preparation was excellent. The                            ileocecal valve, appendiceal orifice, and rectum                            were photographed. Scope In: 10:29:59 AM Scope Out: 10:47:46 AM Scope Withdrawal Time: 0 hours 13 minutes 11 seconds  Total Procedure Duration: 0 hours 17 minutes 47 seconds  Findings:                 The perianal and digital rectal examinations were                            normal.                           A diffuse area of moderate melanosis was found in                            the entire colon.                           Four sessile polyps were found in the rectum and                            ascending colon. The polyps were 1 to 2 mm in size.                            These polyps were removed with a cold biopsy  forceps. Resection and retrieval were complete.                           Non-bleeding internal hemorrhoids were found during                            retroflexion. The hemorrhoids were small.                           The exam was otherwise without abnormality. Complications:            No immediate complications. Estimated Blood Loss:     Estimated blood loss was minimal. Impression:               - Melanosis in the colon.                           - Four 1 to 2 mm polyps in the rectum and in the                            ascending colon, removed with a cold biopsy                            forceps. Resected and retrieved.                           - Non-bleeding internal hemorrhoids.                           - The examination was otherwise normal. Recommendation:           - Patient has a contact number available for                            emergencies. The signs and  symptoms of potential                            delayed complications were discussed with the                            patient. Return to normal activities tomorrow.                            Written discharge instructions were provided to the                            patient.                           - Resume previous diet.                           - Continue present medications.                           - Await pathology results.                           -  Repeat colonoscopy in 5-10 years for surveillance                            based on pathology results. Mauri Pole, MD 05/22/2019 10:51:46 AM This report has been signed electronically.

## 2019-05-22 NOTE — Progress Notes (Signed)
Called to room to assist during endoscopic procedure.  Patient ID and intended procedure confirmed with present staff. Received instructions for my participation in the procedure from the performing physician.  

## 2019-05-22 NOTE — Progress Notes (Signed)
pt tolerated well. VSS. awake and to recovery. Report given to RN.  

## 2019-05-26 ENCOUNTER — Telehealth: Payer: Self-pay

## 2019-05-26 ENCOUNTER — Telehealth: Payer: Self-pay | Admitting: *Deleted

## 2019-05-26 NOTE — Telephone Encounter (Signed)
No answer for post procedure call back. Unable to leave message. 

## 2019-05-26 NOTE — Telephone Encounter (Signed)
No answer on follow up call. 

## 2019-05-27 ENCOUNTER — Encounter: Payer: Self-pay | Admitting: Gastroenterology

## 2019-11-11 ENCOUNTER — Other Ambulatory Visit (HOSPITAL_COMMUNITY): Payer: Self-pay | Admitting: Family Medicine

## 2020-01-19 ENCOUNTER — Other Ambulatory Visit (HOSPITAL_COMMUNITY): Payer: Self-pay | Admitting: Family Medicine

## 2020-02-02 MED FILL — ALPRAZolam 0.5 MG TABS: 0.5 | 30 days supply | Qty: 60 | Fill #0

## 2020-02-06 ENCOUNTER — Other Ambulatory Visit (HOSPITAL_COMMUNITY): Payer: Self-pay | Admitting: Family Medicine

## 2020-02-06 MED FILL — ATORVASTATIN CALCIUM 20 MG: 20 | 90 days supply | Qty: 90 | Fill #0

## 2020-02-06 MED FILL — HYDROCHLOROTHIAZIDE 25 MG T: 25 | 90 days supply | Qty: 90 | Fill #0

## 2020-02-17 ENCOUNTER — Emergency Department (HOSPITAL_COMMUNITY)
Admission: EM | Admit: 2020-02-17 | Discharge: 2020-02-17 | Disposition: A | Discharge status: Home / Self Care | Payer: 59 | Attending: Emergency Medicine | Admitting: Emergency Medicine

## 2020-02-17 ENCOUNTER — Other Ambulatory Visit: Payer: Self-pay

## 2020-02-17 ENCOUNTER — Encounter (HOSPITAL_COMMUNITY): Payer: Self-pay | Admitting: Emergency Medicine

## 2020-02-17 DIAGNOSIS — Z79899 Other long term (current) drug therapy: Secondary | ICD-10-CM | POA: Diagnosis not present

## 2020-02-17 DIAGNOSIS — K0889 Other specified disorders of teeth and supporting structures: Secondary | ICD-10-CM | POA: Insufficient documentation

## 2020-02-17 DIAGNOSIS — I10 Essential (primary) hypertension: Secondary | ICD-10-CM | POA: Diagnosis not present

## 2020-02-17 MED ORDER — KETOROLAC TROMETHAMINE 60 MG/2ML IM SOLN
60.0000 mg | Freq: Once | INTRAMUSCULAR | Status: AC
  Administered 2020-02-17: 60 mg via INTRAMUSCULAR
  Filled 2020-02-17: qty 2

## 2020-02-17 MED ORDER — HYDROCODONE-ACETAMINOPHEN 5-325 MG PO TABS: 1.0000 | ORAL_TABLET | ORAL | 0 refills | Status: DC | PRN

## 2020-02-17 NOTE — Discharge Instructions (Signed)
See your Physician for recheck of blood pressure this week. Follow up with your dentist as scheduled

## 2020-02-17 NOTE — ED Provider Notes (Signed)
[facility] EMERGENCY DEPARTMENT Provider Note   CSN: [id number] Arrival date & time: 02/17/20  1824     History Chief Complaint  Patient presents with  . Dental Pain    Latoya Williams is a 49 y.o. female.  The history is provided by the patient. No language interpreter was used.  Dental Pain Location:  Lower Quality:  Aching Severity:  Severe Onset quality:  Gradual Timing:  Constant Progression:  Worsening Chronicity:  New Relieved by:  Nothing Worsened by:  Nothing Associated symptoms: no gum swelling and no oral bleeding   Pt reports she began having dental pain a week ago.  Pt reports dentist tried to pull tooth but could not get out.  Pt reports she is suppose to see oral surgery on Friday.  Pt is on clindamycin.  Pt is here due to pain.  Pt reports she has been taking tylenol and ibuprofen.      Past Medical History:  Diagnosis Date  . Constipation    on Linzess with soft regular bowel movements   . High cholesterol   . Hypercholesterolemia   . Hypertension     Patient Active Problem List   Diagnosis Date Noted  . Hypertension   . High cholesterol     Past Surgical History:  Procedure Laterality Date  . ABDOMINAL HYSTERECTOMY    . ABDOMINAL SURGERY    . BONE MARROW BIOPSY    . CHOLECYSTECTOMY    . LAPAROSCOPIC OOPHERECTOMY    . OOPHORECTOMY       OB History    Gravida  3   Para  2   Term  2   Preterm  0   AB  1   Living        SAB  1   IAB  0   Ectopic  0   Multiple      Live Births              Family History  Problem Relation Age of Onset  . Hypertension Mother   . Hypertension Sister   . Hypertension Brother   . Colon polyps Father   . Colon cancer Neg Hx   . Esophageal cancer Neg Hx   . Rectal cancer Neg Hx   . Stomach cancer Neg Hx     Social History   Tobacco Use  . Smoking status: Never Smoker  . Smokeless tobacco: Never Used  Vaping Use  . Vaping Use: Never used  Substance Use Topics  .  Alcohol use: Never  . Drug use: Never    Home Medications Prior to Admission medications   Medication Sig Start Date End Date Taking? Authorizing Provider  ALPRAZolam Duanne Moron) 0.5 MG tablet Take 0.5 mg by mouth at bedtime as needed for anxiety.    [provider]  atorvastatin (LIPITOR) 20 MG tablet TAKE ONE TABLET BY MOUTH ONCE DAILY 01/30/16   [provider]  Biotin 5000 MCG CAPS Take by mouth.    [provider]  furosemide (LASIX) 40 MG tablet Take 40 mg by mouth.    [provider]  hydrochlorothiazide (HYDRODIURIL) 25 MG tablet TAKE ONE TABLET BY MOUTH ONCE DAILY 02/05/16   [provider]  linaclotide (LINZESS) 145 MCG CAPS capsule Take 145 mcg by mouth daily before breakfast.    [provider]  ondansetron (ZOFRAN ODT) 4 MG disintegrating tablet Take 1 tablet (4 mg total) by mouth every 8 (eight) hours as needed for nausea or vomiting.  Patient not taking: Reported on 05/22/2019 02/14/16   Isla Pence, MD  ondansetron (ZOFRAN ODT) 8 MG disintegrating tablet Take 1 tablet (8 mg total) by mouth every 8 (eight) hours as needed. Patient not taking: Reported on 05/22/2019 03/17/18   Ripley Fraise, MD  phentermine 37.5 MG capsule Take 37.5 mg by mouth every morning.    [provider]  POTASSIUM GLUCONATE PO Take by mouth.    [provider]  PREMARIN 0.625 MG tablet TAKE ONE TABLET BY MOUTH ONCE DAILY 02/03/16   [provider]  Probiotic Product (PROBIOTIC FORMULA PO) Take 1 capsule by mouth daily.    [provider]    Allergies    Penicillins and Penicillins  Review of Systems   Review of Systems  All other systems reviewed and are negative.   Physical Exam Updated Vital Signs BP (!) 180/96 (BP Location: Right Arm)   Pulse 64   Temp 98 F (36.7 C) (Oral)   Resp 18   Ht 5' 1"  (1.549 m)   Wt 79.8 kg   SpO2 100%   BMI 33.25 kg/m   Physical Exam Vitals reviewed.  HENT:      Head: Normocephalic.     Mouth/Throat:     Mouth: Mucous membranes are moist.     Comments: No swelling to gums no obvious abscess Cardiovascular:     Rate and Rhythm: Normal rate.  Pulmonary:     Effort: Pulmonary effort is normal.  Skin:    General: Skin is warm.  Neurological:     General: No focal deficit present.     Mental Status: She is alert.  Psychiatric:        Mood and Affect: Mood normal.     ED Results / Procedures / Treatments   Labs (all labs ordered are listed, but only abnormal results are displayed) Labs Reviewed - No data to display  EKG None  Radiology No results found.  Procedures Procedures (including critical care time)  Medications Ordered in ED Medications  ketorolac (TORADOL) injection 60 mg (has no administration in time range)    ED Course  I have reviewed the triage vital signs and the nursing notes.  Pertinent labs & imaging results that were available during my care of the patient were reviewed by me and considered in my medical decision making (see chart for details).    MDM Rules/Calculators/A&P                          MDM:  Pt given toradol im at her request.  I will give rx for 10 hydrocodone.  [place] database reviewed.  Continue clindamycin Pt advised to discuss pain medication with her provider is pain persist  Final Clinical Impression(s) / ED Diagnoses Final diagnoses:  Pain, dental  Hypertension, unspecified type    Rx / DC Orders ED Discharge Orders         Ordered    HYDROcodone-acetaminophen (NORCO/VICODIN) 5-325 MG tablet  Every 4 hours PRN        02/17/20 1913        An After Visit Summary was printed and given to the patient.    Fransico Meadow, [place] 02/17/20 1915    Maudie Flakes, MD 02/17/20 2312

## 2020-02-17 NOTE — ED Triage Notes (Signed)
Pt c/o dental pain to tooth number 31 the that began last Thursday. Pt states she has a dental appt. this Friday.

## 2020-02-26 MED FILL — PREMARIN 0.625 MG TABLET: 0.625 | 90 days supply | Qty: 90 | Fill #0

## 2020-02-26 MED FILL — LINZESS 145 MCG CAPSULE: 145 | 90 days supply | Qty: 90 | Fill #0

## 2020-03-10 MED FILL — PHENTERMINE 37.5 MG TABLET: 37.5 | 30 days supply | Qty: 30 | Fill #0

## 2020-04-08 MED FILL — ALPRAZolam 0.5 MG TABS: 0.5 | 30 days supply | Qty: 60 | Fill #1

## 2020-04-22 DIAGNOSIS — I10 Essential (primary) hypertension: Secondary | ICD-10-CM | POA: Diagnosis not present

## 2020-04-22 DIAGNOSIS — Z6833 Body mass index (BMI) 33.0-33.9, adult: Secondary | ICD-10-CM | POA: Diagnosis not present

## 2020-05-10 ENCOUNTER — Other Ambulatory Visit (HOSPITAL_COMMUNITY): Payer: Self-pay | Admitting: Family Medicine

## 2020-05-10 DIAGNOSIS — F902 Attention-deficit hyperactivity disorder, combined type: Secondary | ICD-10-CM | POA: Diagnosis not present

## 2020-05-10 DIAGNOSIS — Z6833 Body mass index (BMI) 33.0-33.9, adult: Secondary | ICD-10-CM | POA: Diagnosis not present

## 2020-05-10 DIAGNOSIS — F419 Anxiety disorder, unspecified: Secondary | ICD-10-CM | POA: Diagnosis not present

## 2020-05-10 DIAGNOSIS — Z1331 Encounter for screening for depression: Secondary | ICD-10-CM | POA: Diagnosis not present

## 2020-05-10 DIAGNOSIS — N951 Menopausal and female climacteric states: Secondary | ICD-10-CM | POA: Diagnosis not present

## 2020-05-10 DIAGNOSIS — I10 Essential (primary) hypertension: Secondary | ICD-10-CM | POA: Diagnosis not present

## 2020-05-10 DIAGNOSIS — K5901 Slow transit constipation: Secondary | ICD-10-CM | POA: Diagnosis not present

## 2020-05-10 DIAGNOSIS — Z1389 Encounter for screening for other disorder: Secondary | ICD-10-CM | POA: Diagnosis not present

## 2020-05-10 DIAGNOSIS — E782 Mixed hyperlipidemia: Secondary | ICD-10-CM | POA: Diagnosis not present

## 2020-05-10 DIAGNOSIS — E7849 Other hyperlipidemia: Secondary | ICD-10-CM | POA: Diagnosis not present

## 2020-05-10 MED FILL — LOSARTAN POTASSIUM 100 MG T: 100 | 90 days supply | Qty: 90 | Fill #0

## 2020-05-10 MED FILL — HYDROCHLOROTHIAZIDE 25 MG T: 25 | 90 days supply | Qty: 90 | Fill #0

## 2020-05-10 MED FILL — PHENTERMINE 37.5 MG TABLET: 37.5 | 30 days supply | Qty: 30 | Fill #0

## 2020-05-10 MED FILL — ALPRAZolam 0.5 MG TABS: 0.5 | 90 days supply | Qty: 180 | Fill #0

## 2020-05-10 MED FILL — ATORVASTATIN CALCIUM 20 MG: 20 | 90 days supply | Qty: 90 | Fill #0

## 2020-05-26 MED FILL — PREMARIN 0.625 MG TABLET: 0.625 | 90 days supply | Qty: 90 | Fill #0

## 2020-06-04 MED FILL — LINZESS 145 MCG CAPSULE: 145 | 90 days supply | Qty: 90 | Fill #0

## 2020-06-05 ENCOUNTER — Other Ambulatory Visit (HOSPITAL_COMMUNITY): Payer: Self-pay

## 2020-06-14 DIAGNOSIS — H524 Presbyopia: Secondary | ICD-10-CM | POA: Diagnosis not present

## 2020-06-14 DIAGNOSIS — H52223 Regular astigmatism, bilateral: Secondary | ICD-10-CM | POA: Diagnosis not present

## 2020-06-14 DIAGNOSIS — H5201 Hypermetropia, right eye: Secondary | ICD-10-CM | POA: Diagnosis not present

## 2020-06-15 ENCOUNTER — Other Ambulatory Visit (HOSPITAL_COMMUNITY): Payer: Self-pay

## 2020-06-18 ENCOUNTER — Other Ambulatory Visit (HOSPITAL_COMMUNITY): Payer: Self-pay

## 2020-06-22 DIAGNOSIS — J0101 Acute recurrent maxillary sinusitis: Secondary | ICD-10-CM | POA: Diagnosis not present

## 2020-07-29 ENCOUNTER — Other Ambulatory Visit (HOSPITAL_COMMUNITY): Payer: Self-pay

## 2020-07-29 MED FILL — Phentermine HCl Tab 37.5 MG: ORAL | 30 days supply | Qty: 30 | Fill #0 | Status: AC

## 2020-07-30 ENCOUNTER — Other Ambulatory Visit (HOSPITAL_COMMUNITY): Payer: Self-pay

## 2020-08-13 ENCOUNTER — Other Ambulatory Visit (HOSPITAL_COMMUNITY): Payer: Self-pay

## 2020-08-13 MED FILL — Hydrochlorothiazide Tab 25 MG: ORAL | 90 days supply | Qty: 90 | Fill #0 | Status: AC

## 2020-08-13 MED FILL — Losartan Potassium Tab 100 MG: ORAL | 90 days supply | Qty: 90 | Fill #0 | Status: AC

## 2020-08-19 ENCOUNTER — Other Ambulatory Visit (HOSPITAL_COMMUNITY): Payer: Self-pay

## 2020-08-19 MED FILL — Atorvastatin Calcium Tab 20 MG (Base Equivalent): ORAL | 90 days supply | Qty: 90 | Fill #0 | Status: AC

## 2020-08-30 ENCOUNTER — Other Ambulatory Visit (HOSPITAL_COMMUNITY): Payer: Self-pay

## 2020-08-30 MED FILL — Estrogens, Conjugated Tab 0.625 MG: ORAL | 90 days supply | Qty: 90 | Fill #0 | Status: AC

## 2020-08-31 MED FILL — Linaclotide Cap 145 MCG: ORAL | 90 days supply | Qty: 90 | Fill #0 | Status: AC

## 2020-09-01 ENCOUNTER — Other Ambulatory Visit (HOSPITAL_COMMUNITY): Payer: Self-pay

## 2020-09-03 DIAGNOSIS — Z0001 Encounter for general adult medical examination with abnormal findings: Secondary | ICD-10-CM | POA: Diagnosis not present

## 2020-09-03 DIAGNOSIS — I10 Essential (primary) hypertension: Secondary | ICD-10-CM | POA: Diagnosis not present

## 2020-09-03 DIAGNOSIS — Z1329 Encounter for screening for other suspected endocrine disorder: Secondary | ICD-10-CM | POA: Diagnosis not present

## 2020-09-03 DIAGNOSIS — E7849 Other hyperlipidemia: Secondary | ICD-10-CM | POA: Diagnosis not present

## 2020-09-03 DIAGNOSIS — E782 Mixed hyperlipidemia: Secondary | ICD-10-CM | POA: Diagnosis not present

## 2020-09-06 ENCOUNTER — Ambulatory Visit
Admission: EM | Admit: 2020-09-06 | Discharge: 2020-09-06 | Disposition: A | Discharge status: Home / Self Care | Payer: 59 | Attending: Family Medicine | Admitting: Family Medicine

## 2020-09-06 ENCOUNTER — Other Ambulatory Visit: Payer: Self-pay

## 2020-09-06 ENCOUNTER — Encounter: Payer: Self-pay | Admitting: Emergency Medicine

## 2020-09-06 DIAGNOSIS — L03113 Cellulitis of right upper limb: Secondary | ICD-10-CM | POA: Diagnosis not present

## 2020-09-06 DIAGNOSIS — T63444A Toxic effect of venom of bees, undetermined, initial encounter: Secondary | ICD-10-CM

## 2020-09-06 MED ORDER — SULFAMETHOXAZOLE-TRIMETHOPRIM 800-160 MG PO TABS: 1.0000 | ORAL_TABLET | Freq: Two times a day (BID) | ORAL | 0 refills | Status: DC

## 2020-09-06 MED ORDER — DEXAMETHASONE SODIUM PHOSPHATE 10 MG/ML IJ SOLN
10.0000 mg | Freq: Once | INTRAMUSCULAR | Status: AC
  Administered 2020-09-06: 10 mg via INTRAMUSCULAR

## 2020-09-06 MED ORDER — TRIAMCINOLONE ACETONIDE 0.025 % EX OINT: 1.0000 "application " | TOPICAL_OINTMENT | Freq: Three times a day (TID) | CUTANEOUS | 0 refills | Status: DC | PRN

## 2020-09-06 NOTE — ED Triage Notes (Signed)
Pt got bit by an insect on her RT arm on Saturday.  Arm became swollen, red and itchy on Sunday.  Has tried ice, benadryl, cortisone cream with no relief.

## 2020-09-06 NOTE — ED Provider Notes (Signed)
[facility]    CSN: [id number] Arrival date & time: 09/06/20  1626      History   Chief Complaint No chief complaint on file.   HPI Latoya Williams is a 50 y.o. female.   HPI Patient presents for evaluation of swelling, redness and pain to the right upper extremity following a bee sting attack 2 days ago.  She reports yesterday she developed a mild red rash which has now extended to cover most of her upper and lower arm.  Swelling has gradually increased and the area is itching.  She has been taking Benadryl and applying cool compresses without relief.  She is afebrile. Past Medical History:  Diagnosis Date   Constipation    on Linzess with soft regular bowel movements    High cholesterol    Hypercholesterolemia    Hypertension     Patient Active Problem List   Diagnosis Date Noted   Hypertension    High cholesterol     Past Surgical History:  Procedure Laterality Date   ABDOMINAL HYSTERECTOMY     ABDOMINAL SURGERY     BONE MARROW BIOPSY     CHOLECYSTECTOMY     LAPAROSCOPIC OOPHERECTOMY     OOPHORECTOMY      OB History     Gravida  3   Para  2   Term  2   Preterm  0   AB  1   Living         SAB  1   IAB  0   Ectopic  0   Multiple      Live Births               Home Medications    Prior to Admission medications   Medication Sig Start Date End Date Taking? Authorizing Provider  ALPRAZolam Duanne Moron) 0.5 MG tablet Take 0.5 mg by mouth at bedtime as needed for anxiety.    [provider]  ALPRAZolam Duanne Moron) 0.5 MG tablet TAKE 1 TABLET BY MOUTH 2 TIMES DAILY AS NEEDED 05/10/20 11/06/20  Lanelle Bal, PA-C  atorvastatin (LIPITOR) 20 MG tablet TAKE ONE TABLET BY MOUTH ONCE DAILY 01/30/16   [provider]  atorvastatin (LIPITOR) 20 MG tablet TAKE 1 TABLET BY MOUTH DAILY 05/10/20 05/10/21  Lanelle Bal, PA-C  atorvastatin (LIPITOR) 20 MG tablet TAKE 1 TABLET BY MOUTH DAILY 02/06/20 02/05/21  Lanelle Bal, PA-C  Biotin  5000 MCG CAPS Take by mouth.    [provider]  estrogens, conjugated, (PREMARIN) 0.625 MG tablet TAKE 1 TABLET BY MOUTH ONCE DAILY 05/10/20 05/10/21  Lanelle Bal, PA-C  estrogens, conjugated, (PREMARIN) 0.625 MG tablet TAKE 1 TABLET BY MOUTH DAILY 01/19/20 01/18/21  Lanelle Bal, PA-C  furosemide (LASIX) 40 MG tablet Take 40 mg by mouth.    [provider]  hydrochlorothiazide (HYDRODIURIL) 25 MG tablet TAKE ONE TABLET BY MOUTH ONCE DAILY 02/05/16   [provider]  hydrochlorothiazide (HYDRODIURIL) 25 MG tablet Take 1 tablet (25 mg total) by mouth daily. 05/10/20   Lanelle Bal, PA-C  HYDROcodone-acetaminophen (NORCO/VICODIN) 5-325 MG tablet Take 1 tablet by mouth every 4 (four) hours as needed. 02/17/20   Fransico Meadow, PA-C  linaclotide (LINZESS) 145 MCG CAPS capsule Take 145 mcg by mouth daily before breakfast.    [provider]  linaclotide (LINZESS) 145 MCG CAPS capsule TAKE 1 CAPSULE BY MOUTH ONCE DAILY 05/10/20 05/10/21  Lanelle Bal, PA-C  linaclotide [facility]) 145 MCG CAPS capsule TAKE 1 CAPSULE  BY MOUTH EVERY DAY 11/11/19 11/10/20  Lanelle Bal, PA-C  losartan (COZAAR) 100 MG tablet Take 0.5 tablets (50 mg total) by mouth 2 (two) times daily. 05/10/20   Lanelle Bal, PA-C  ondansetron (ZOFRAN ODT) 4 MG disintegrating tablet Take 1 tablet (4 mg total) by mouth every 8 (eight) hours as needed for nausea or vomiting. Patient not taking: Reported on 05/22/2019 02/14/16   Isla Pence, MD  ondansetron (ZOFRAN ODT) 8 MG disintegrating tablet Take 1 tablet (8 mg total) by mouth every 8 (eight) hours as needed. Patient not taking: Reported on 05/22/2019 03/17/18   Ripley Fraise, MD  phentermine (ADIPEX-P) 37.5 MG tablet TAKE 1 TABLET BY MOUTH ONCE DAILY 05/10/20 11/06/20  Lanelle Bal, PA-C  phentermine (ADIPEX-P) 37.5 MG tablet TAKE 1 TABLET BY MOUTH EVERY DAY 11/11/19 05/09/20  Lanelle Bal, PA-C  phentermine 37.5 MG capsule Take 37.5 mg by mouth every morning.     [provider]  POTASSIUM GLUCONATE PO Take by mouth.    [provider]  PREMARIN 0.625 MG tablet TAKE ONE TABLET BY MOUTH ONCE DAILY 02/03/16   [provider]  Probiotic Product (PROBIOTIC FORMULA PO) Take 1 capsule by mouth daily.    [provider]    Family History Family History  Problem Relation Age of Onset   Hypertension Mother    Hypertension Sister    Hypertension Brother    Colon polyps Father    Colon cancer Neg Hx    Esophageal cancer Neg Hx    Rectal cancer Neg Hx    Stomach cancer Neg Hx     Social History Social History   Tobacco Use   Smoking status: Never   Smokeless tobacco: Never  Vaping Use   Vaping Use: Never used  Substance Use Topics   Alcohol use: Never   Drug use: Never     Allergies   Penicillins and Penicillins   Review of Systems Review of Systems Pertinent negatives listed in HPI   Physical Exam Triage Vital Signs ED Triage Vitals [09/06/20 1639]  Enc Vitals Group     BP 134/84     Pulse Rate 84     Resp 17     Temp 97.7 F (36.5 C)     Temp Source Oral     SpO2 97 %     Weight      Height      Head Circumference      Peak Flow      Pain Score 0     Pain Loc      Pain Edu?      Excl. in [place]?    No data found.  Updated Vital Signs BP 134/84 (BP Location: Left Arm)   Pulse 84   Temp 97.7 F (36.5 C) (Oral)   Resp 17   SpO2 97%   Visual Acuity Right Eye Distance:   Left Eye Distance:   Bilateral Distance:    Right Eye Near:   Left Eye Near:    Bilateral Near:     Physical Exam General appearance: alert, well developed, well nourished, cooperative  Head: Normocephalic, without obvious abnormality, atraumatic Respiratory: Respirations even and unlabored, normal respiratory rate Heart: Rate and rhythm normal. No gallop or murmurs noted on exam  Extremities: Right upper extremity erythematous, moderate swelling increased warmth, macular rash distribution RUE Skin: Skin  color, texture, turgor normal. No rashes seen  Psych: Appropriate mood and affect. Neurologic: Mental status: Alert, oriented to person, place,  and time, thought content appropriate.   UC Treatments / Results  Labs (all labs ordered are listed, but only abnormal results are displayed) Labs Reviewed - No data to display  EKG   Radiology No results found.  Procedures Procedures (including critical care time)  Medications Ordered in UC Medications - No data to display  Initial Impression / Assessment and Plan / UC Course  I have reviewed the triage vital signs and the nursing notes.  Pertinent labs & imaging results that were available during my care of the patient were reviewed by me and considered in my medical decision making (see chart for details).     Cellulitis of the right upper arm secondary to a bee sting attack. IM Decadron 10 given here in clinic today.  Patient will continue treatment at home with cetirizine 10 mg at bedtime which she already has.  Start Bactrim twice daily for 10 days.  Triamcinolone cream 3 times daily as needed for itching and irritation.  Continue to monitor site for worsening infection return precautions given Final Clinical Impressions(s) / UC Diagnoses   Final diagnoses:  Cellulitis of right upper extremity  Bee sting reaction, undetermined intent, initial encounter   Discharge Instructions   None    ED Prescriptions     Medication Sig Dispense Auth. Provider   sulfamethoxazole-trimethoprim (BACTRIM DS) 800-160 MG tablet Take 1 tablet by mouth 2 (two) times daily. 20 tablet Scot Jun, FNP   triamcinolone (KENALOG) 0.025 % ointment Apply 1 application topically 3 (three) times daily as needed. 90 g Scot Jun, FNP      PDMP not reviewed this encounter.   Scot Jun, FNP 09/06/20 [phone]

## 2020-09-23 ENCOUNTER — Other Ambulatory Visit (HOSPITAL_COMMUNITY): Payer: Self-pay | Admitting: Family Medicine

## 2020-09-23 ENCOUNTER — Other Ambulatory Visit (HOSPITAL_COMMUNITY): Payer: Self-pay

## 2020-09-23 DIAGNOSIS — Z1231 Encounter for screening mammogram for malignant neoplasm of breast: Secondary | ICD-10-CM

## 2020-09-23 DIAGNOSIS — N951 Menopausal and female climacteric states: Secondary | ICD-10-CM | POA: Diagnosis not present

## 2020-09-23 DIAGNOSIS — F419 Anxiety disorder, unspecified: Secondary | ICD-10-CM | POA: Diagnosis not present

## 2020-09-23 DIAGNOSIS — Z6834 Body mass index (BMI) 34.0-34.9, adult: Secondary | ICD-10-CM | POA: Diagnosis not present

## 2020-09-23 DIAGNOSIS — Z0000 Encounter for general adult medical examination without abnormal findings: Secondary | ICD-10-CM | POA: Diagnosis not present

## 2020-09-23 DIAGNOSIS — I10 Essential (primary) hypertension: Secondary | ICD-10-CM | POA: Diagnosis not present

## 2020-09-23 DIAGNOSIS — E7849 Other hyperlipidemia: Secondary | ICD-10-CM | POA: Diagnosis not present

## 2020-09-23 MED ORDER — HYDROCORTISONE (PERIANAL) 2.5 % EX CREA
1.0000 "application " | TOPICAL_CREAM | Freq: Two times a day (BID) | CUTANEOUS | 6 refills | Status: DC
  Filled 2020-09-23: qty 30, 10d supply, fill #0
  Filled 2021-02-17: qty 30, 10d supply, fill #1
  Filled 2021-05-27: qty 30, 10d supply, fill #2
  Filled 2021-08-17: qty 15, 5d supply, fill #3

## 2020-09-23 MED ORDER — PREMARIN 0.625 MG PO TABS
0.6250 mg | ORAL_TABLET | Freq: Every day | ORAL | 1 refills | Status: DC
  Filled 2020-09-23 – 2020-11-12 (×2): qty 90, 90d supply, fill #0
  Filled 2021-02-17: qty 90, 90d supply, fill #1

## 2020-09-23 MED FILL — Phentermine HCl Tab 37.5 MG: ORAL | 30 days supply | Qty: 30 | Fill #1 | Status: AC

## 2020-09-24 ENCOUNTER — Other Ambulatory Visit (HOSPITAL_COMMUNITY): Payer: Self-pay

## 2020-10-08 ENCOUNTER — Ambulatory Visit (HOSPITAL_COMMUNITY): Payer: 59

## 2020-11-12 ENCOUNTER — Other Ambulatory Visit (HOSPITAL_COMMUNITY): Payer: Self-pay

## 2020-11-12 MED FILL — Linaclotide Cap 145 MCG: ORAL | 90 days supply | Qty: 90 | Fill #1 | Status: AC

## 2020-11-12 MED FILL — Losartan Potassium Tab 100 MG: ORAL | 90 days supply | Qty: 90 | Fill #1 | Status: AC

## 2020-11-12 MED FILL — Atorvastatin Calcium Tab 20 MG (Base Equivalent): ORAL | 90 days supply | Qty: 90 | Fill #1 | Status: AC

## 2020-11-12 MED FILL — Hydrochlorothiazide Tab 25 MG: ORAL | 90 days supply | Qty: 90 | Fill #1 | Status: AC

## 2020-11-15 ENCOUNTER — Other Ambulatory Visit (HOSPITAL_COMMUNITY): Payer: Self-pay

## 2020-11-16 ENCOUNTER — Other Ambulatory Visit (HOSPITAL_COMMUNITY): Payer: Self-pay

## 2020-11-16 DIAGNOSIS — F419 Anxiety disorder, unspecified: Secondary | ICD-10-CM | POA: Diagnosis not present

## 2020-11-16 DIAGNOSIS — E782 Mixed hyperlipidemia: Secondary | ICD-10-CM | POA: Diagnosis not present

## 2020-11-16 DIAGNOSIS — I10 Essential (primary) hypertension: Secondary | ICD-10-CM | POA: Diagnosis not present

## 2020-11-16 DIAGNOSIS — K5901 Slow transit constipation: Secondary | ICD-10-CM | POA: Diagnosis not present

## 2020-11-16 DIAGNOSIS — N951 Menopausal and female climacteric states: Secondary | ICD-10-CM | POA: Diagnosis not present

## 2020-11-16 DIAGNOSIS — Z6834 Body mass index (BMI) 34.0-34.9, adult: Secondary | ICD-10-CM | POA: Diagnosis not present

## 2020-11-16 DIAGNOSIS — E7849 Other hyperlipidemia: Secondary | ICD-10-CM | POA: Diagnosis not present

## 2020-11-16 MED ORDER — ALPRAZOLAM 0.5 MG PO TABS
0.5000 mg | ORAL_TABLET | Freq: Two times a day (BID) | ORAL | 1 refills | Status: DC | PRN
  Filled 2020-11-16: qty 180, 90d supply, fill #0
  Filled 2021-02-17: qty 180, 90d supply, fill #1

## 2020-11-16 MED ORDER — PHENTERMINE HCL 37.5 MG PO TABS
37.5000 mg | ORAL_TABLET | Freq: Every day | ORAL | 2 refills | Status: DC
  Filled 2020-11-16: qty 30, 30d supply, fill #0
  Filled 2021-01-11: qty 30, 30d supply, fill #1
  Filled 2021-02-17: qty 30, 30d supply, fill #2

## 2021-01-11 ENCOUNTER — Other Ambulatory Visit (HOSPITAL_COMMUNITY): Payer: Self-pay

## 2021-01-18 DIAGNOSIS — J111 Influenza due to unidentified influenza virus with other respiratory manifestations: Secondary | ICD-10-CM | POA: Diagnosis not present

## 2021-01-18 DIAGNOSIS — Z20828 Contact with and (suspected) exposure to other viral communicable diseases: Secondary | ICD-10-CM | POA: Diagnosis not present

## 2021-02-11 ENCOUNTER — Other Ambulatory Visit (HOSPITAL_COMMUNITY): Payer: Self-pay

## 2021-02-11 MED FILL — Losartan Potassium Tab 100 MG: ORAL | 90 days supply | Qty: 90 | Fill #2 | Status: AC

## 2021-02-17 ENCOUNTER — Other Ambulatory Visit (HOSPITAL_COMMUNITY): Payer: Self-pay

## 2021-02-17 MED FILL — Linaclotide Cap 145 MCG: ORAL | 90 days supply | Qty: 90 | Fill #2 | Status: AC

## 2021-02-17 MED FILL — Atorvastatin Calcium Tab 20 MG (Base Equivalent): ORAL | 90 days supply | Qty: 90 | Fill #2 | Status: AC

## 2021-02-17 MED FILL — Hydrochlorothiazide Tab 25 MG: ORAL | 90 days supply | Qty: 90 | Fill #2 | Status: AC

## 2021-03-14 DIAGNOSIS — F419 Anxiety disorder, unspecified: Secondary | ICD-10-CM | POA: Diagnosis not present

## 2021-03-14 DIAGNOSIS — N951 Menopausal and female climacteric states: Secondary | ICD-10-CM | POA: Diagnosis not present

## 2021-03-14 DIAGNOSIS — K5901 Slow transit constipation: Secondary | ICD-10-CM | POA: Diagnosis not present

## 2021-03-14 DIAGNOSIS — E782 Mixed hyperlipidemia: Secondary | ICD-10-CM | POA: Diagnosis not present

## 2021-03-14 DIAGNOSIS — I10 Essential (primary) hypertension: Secondary | ICD-10-CM | POA: Diagnosis not present

## 2021-03-23 ENCOUNTER — Other Ambulatory Visit (HOSPITAL_COMMUNITY): Payer: Self-pay

## 2021-03-23 DIAGNOSIS — Z6833 Body mass index (BMI) 33.0-33.9, adult: Secondary | ICD-10-CM | POA: Diagnosis not present

## 2021-03-23 DIAGNOSIS — E7849 Other hyperlipidemia: Secondary | ICD-10-CM | POA: Diagnosis not present

## 2021-03-23 DIAGNOSIS — N951 Menopausal and female climacteric states: Secondary | ICD-10-CM | POA: Diagnosis not present

## 2021-03-23 DIAGNOSIS — R079 Chest pain, unspecified: Secondary | ICD-10-CM | POA: Diagnosis not present

## 2021-03-23 DIAGNOSIS — F419 Anxiety disorder, unspecified: Secondary | ICD-10-CM | POA: Diagnosis not present

## 2021-03-23 DIAGNOSIS — I10 Essential (primary) hypertension: Secondary | ICD-10-CM | POA: Diagnosis not present

## 2021-03-23 MED ORDER — PREMARIN 0.625 MG PO TABS
0.6250 mg | ORAL_TABLET | Freq: Every day | ORAL | 1 refills | Status: DC
  Filled 2021-03-23 – 2021-05-27 (×2): qty 90, 90d supply, fill #0
  Filled 2021-08-20: qty 90, 90d supply, fill #1

## 2021-03-24 ENCOUNTER — Other Ambulatory Visit (HOSPITAL_COMMUNITY): Payer: Self-pay | Admitting: Family Medicine

## 2021-03-24 ENCOUNTER — Other Ambulatory Visit: Payer: Self-pay | Admitting: Family Medicine

## 2021-03-24 DIAGNOSIS — E782 Mixed hyperlipidemia: Secondary | ICD-10-CM

## 2021-04-01 ENCOUNTER — Ambulatory Visit (HOSPITAL_COMMUNITY): Payer: 59 | Attending: Family Medicine

## 2021-04-01 ENCOUNTER — Encounter (HOSPITAL_COMMUNITY): Payer: Self-pay

## 2021-04-26 ENCOUNTER — Other Ambulatory Visit (HOSPITAL_COMMUNITY): Payer: Self-pay

## 2021-05-10 ENCOUNTER — Other Ambulatory Visit: Payer: Self-pay

## 2021-05-10 ENCOUNTER — Encounter: Payer: Self-pay | Admitting: Emergency Medicine

## 2021-05-10 ENCOUNTER — Ambulatory Visit
Admission: EM | Admit: 2021-05-10 | Discharge: 2021-05-10 | Disposition: A | Discharge status: Home / Self Care | Payer: 59 | Attending: Family Medicine | Admitting: Family Medicine

## 2021-05-10 DIAGNOSIS — Z1152 Encounter for screening for COVID-19: Secondary | ICD-10-CM

## 2021-05-10 NOTE — ED Notes (Signed)
Pt reports spoke with boss about care plan and inquired about rapid covid swab. Pt aware that rapid swabs are not available at [facility]. Per pt, boss stated that pt needed to have rapid test completed and to go somewhere where it was available. Pt apologetic but states to still send specimen and would like to leave at this time.  ? ?Pt left prior to provider assessment. Nurse visit charge capture entered. Pt aware and verbalized understanding. ?

## 2021-05-10 NOTE — ED Triage Notes (Signed)
Pt reports chills, headache, nausea onset this am. Pt reports was sent from work and is requiring a covid/flu test.  ?

## 2021-05-11 ENCOUNTER — Other Ambulatory Visit (HOSPITAL_COMMUNITY): Payer: Self-pay | Admitting: Family Medicine

## 2021-05-11 DIAGNOSIS — J069 Acute upper respiratory infection, unspecified: Secondary | ICD-10-CM | POA: Diagnosis not present

## 2021-05-11 DIAGNOSIS — R059 Cough, unspecified: Secondary | ICD-10-CM | POA: Diagnosis not present

## 2021-05-11 DIAGNOSIS — R748 Abnormal levels of other serum enzymes: Secondary | ICD-10-CM

## 2021-05-11 LAB — COVID-19, FLU A+B NAA
Influenza A, NAA: NOT DETECTED
Influenza B, NAA: NOT DETECTED
SARS-CoV-2, NAA: NOT DETECTED

## 2021-05-17 ENCOUNTER — Other Ambulatory Visit (HOSPITAL_COMMUNITY): Payer: Self-pay

## 2021-05-17 MED ORDER — LOSARTAN POTASSIUM 100 MG PO TABS
50.0000 mg | ORAL_TABLET | Freq: Two times a day (BID) | ORAL | 3 refills | Status: DC
  Filled 2021-05-17: qty 90, 90d supply, fill #0
  Filled 2021-07-18 – 2021-07-29 (×2): qty 90, 90d supply, fill #1
  Filled 2021-10-31: qty 90, 90d supply, fill #2
  Filled 2022-01-30: qty 90, 90d supply, fill #3

## 2021-05-18 ENCOUNTER — Other Ambulatory Visit (HOSPITAL_COMMUNITY): Payer: Self-pay

## 2021-05-20 ENCOUNTER — Other Ambulatory Visit (HOSPITAL_COMMUNITY): Payer: Self-pay

## 2021-05-23 ENCOUNTER — Other Ambulatory Visit (HOSPITAL_COMMUNITY): Payer: Self-pay

## 2021-05-24 ENCOUNTER — Other Ambulatory Visit (HOSPITAL_COMMUNITY): Payer: Self-pay

## 2021-05-24 MED ORDER — HYDROCHLOROTHIAZIDE 25 MG PO TABS
25.0000 mg | ORAL_TABLET | Freq: Every day | ORAL | 0 refills | Status: DC
  Filled 2021-05-24: qty 90, 90d supply, fill #0

## 2021-05-25 ENCOUNTER — Other Ambulatory Visit (HOSPITAL_COMMUNITY): Payer: Self-pay

## 2021-05-25 MED ORDER — HYDROCHLOROTHIAZIDE 25 MG PO TABS
25.0000 mg | ORAL_TABLET | Freq: Every day | ORAL | 5 refills | Status: DC
  Filled 2021-05-25 – 2021-08-17 (×2): qty 90, 90d supply, fill #0
  Filled 2021-11-15: qty 90, 90d supply, fill #1
  Filled 2022-01-31: qty 90, 90d supply, fill #2
  Filled 2022-02-13 – 2022-05-09 (×2): qty 90, 90d supply, fill #3
  Filled 2022-05-12: qty 90, 90d supply, fill #0

## 2021-05-27 ENCOUNTER — Other Ambulatory Visit (HOSPITAL_COMMUNITY): Payer: Self-pay

## 2021-05-27 MED ORDER — ALPRAZOLAM 0.5 MG PO TABS
0.5000 mg | ORAL_TABLET | Freq: Two times a day (BID) | ORAL | 1 refills | Status: DC | PRN
  Filled 2021-05-27: qty 180, 90d supply, fill #0

## 2021-05-30 ENCOUNTER — Other Ambulatory Visit (HOSPITAL_COMMUNITY): Payer: Self-pay

## 2021-05-31 ENCOUNTER — Other Ambulatory Visit (HOSPITAL_COMMUNITY): Payer: Self-pay

## 2021-05-31 MED ORDER — ATORVASTATIN CALCIUM 20 MG PO TABS
20.0000 mg | ORAL_TABLET | Freq: Every day | ORAL | 3 refills | Status: DC
  Filled 2021-05-31: qty 90, 90d supply, fill #0
  Filled 2021-08-23: qty 90, 90d supply, fill #1
  Filled 2021-11-28: qty 90, 90d supply, fill #2
  Filled 2022-02-23: qty 90, 90d supply, fill #3

## 2021-06-01 ENCOUNTER — Other Ambulatory Visit (HOSPITAL_COMMUNITY): Payer: Self-pay

## 2021-06-02 ENCOUNTER — Other Ambulatory Visit (HOSPITAL_COMMUNITY): Payer: Self-pay

## 2021-06-02 MED ORDER — LINZESS 145 MCG PO CAPS
145.0000 ug | ORAL_CAPSULE | Freq: Every day | ORAL | 3 refills | Status: DC
  Filled 2021-06-02: qty 90, 90d supply, fill #0
  Filled 2021-08-27: qty 90, 90d supply, fill #1
  Filled 2021-11-28: qty 90, 90d supply, fill #2
  Filled 2022-02-23: qty 90, 90d supply, fill #3

## 2021-06-03 ENCOUNTER — Other Ambulatory Visit (HOSPITAL_COMMUNITY): Payer: Self-pay

## 2021-06-03 ENCOUNTER — Encounter: Payer: Self-pay | Admitting: Internal Medicine

## 2021-06-03 ENCOUNTER — Ambulatory Visit: Payer: 59 | Admitting: Internal Medicine

## 2021-06-03 VITALS — BP 130/70 | HR 72 | Ht 61.0 in | Wt 187.4 lb

## 2021-06-03 DIAGNOSIS — R079 Chest pain, unspecified: Secondary | ICD-10-CM | POA: Insufficient documentation

## 2021-06-03 DIAGNOSIS — I10 Essential (primary) hypertension: Secondary | ICD-10-CM | POA: Diagnosis not present

## 2021-06-03 DIAGNOSIS — R600 Localized edema: Secondary | ICD-10-CM | POA: Insufficient documentation

## 2021-06-03 DIAGNOSIS — E782 Mixed hyperlipidemia: Secondary | ICD-10-CM

## 2021-06-03 NOTE — Patient Instructions (Addendum)
Testing/Procedures: ?Your physician has requested that you have a Exercise Stress Test. For further information please visit [email or web address]. Please follow instruction sheet, as given. ? ? ?Follow-Up: ?Follow up with Dr. Gasper Sells as needed.  ? ?Any Other Special Instructions Will Be Listed Below (If Applicable). ? ? ? ? ?If you need a refill on your cardiac medications before your next appointment, please call your pharmacy. ? ?

## 2021-06-03 NOTE — Progress Notes (Signed)
?Cardiology Office Note:   ? ?Date:  06/03/2021  ? ?ID:  Latoya Williams, DOB [birth year], MRN [id number] ? ?PCP:  [facility] ?  ?[facility] Providers ?Cardiologist:  None    ? ?Referring MD: Lanelle Bal, PA-C  ? ?CC: Worn out ?Consulted for the evaluation of CP at the behest of [facility] ? ?History of Present Illness:   ? ?Latoya Williams is a 51 y.o. female with a hx of HTN, HLD who presents for evaluation 06/03/21. ?EKG 1/23 SR 60 no QRS. ? ?Patient notes that she is feeling a lot of stress lately. ?She is at the nursing home with her mother after and its tough (mother is bedridden after broken hip; she walked in there). ?Even before that has had chest pain. ?It runs down to her arm and it tingles.  ?Chest pain is independent of activity, sometimes radiates to her back.   ?No FHX of heart disease ? ?No shortness of breath, DOE .  No PND or orthopnea.  No weight gain, leg swelling (it is improving now that she is abck on HCTZ; she is on lasix as a PRN), no abdominal swelling.  No syncope or near syncope . Notes  no palpitations or funny heart beats.    ? ?No history of pre-eclampsia, gestation HTN or gestational DM.  Has two grown sons. ? ?PCPA w/u normal BNP, Platelets low.   ? ?Past Medical History:  ?Diagnosis Date  ? Constipation   ? on Linzess with soft regular bowel movements   ? High cholesterol   ? Hypercholesterolemia   ? Hypertension   ? ? ?Past Surgical History:  ?Procedure Laterality Date  ? ABDOMINAL HYSTERECTOMY    ? ABDOMINAL SURGERY    ? BONE MARROW BIOPSY    ? CHOLECYSTECTOMY    ? LAPAROSCOPIC OOPHERECTOMY    ? OOPHORECTOMY    ? ? ?Current Medications: ?Current Meds  ?Medication Sig  ? ALPRAZolam (XANAX) 0.5 MG tablet Take 0.5 mg by mouth at bedtime as needed for anxiety.  ? atorvastatin (LIPITOR) 20 MG tablet Take 1 tablet (20 mg total) by mouth daily.  ? Biotin 5000 MCG CAPS Take by mouth.  ? estrogens, conjugated, (PREMARIN) 0.625 MG tablet Take 1 tablet  (0.625 mg total) by mouth daily.  ? furosemide (LASIX) 40 MG tablet Take 40 mg by mouth.  ? hydrochlorothiazide (HYDRODIURIL) 25 MG tablet Take 1 tablet (25 mg total) by mouth daily.  ? hydrocortisone (ANUSOL-HC) 2.5 % rectal cream Apply 1 application topically 2 (two) times daily.  ? linaclotide (LINZESS) 145 MCG CAPS capsule Take 1 capsule (145 mcg total) by mouth daily.  ? losartan (COZAAR) 100 MG tablet Take 0.5 tablets (50 mg total) by mouth 2 (two) times daily.  ? POTASSIUM GLUCONATE PO Take by mouth.  ? PREMARIN 0.625 MG tablet TAKE ONE TABLET BY MOUTH ONCE DAILY  ? Probiotic Product (PROBIOTIC FORMULA PO) Take 1 capsule by mouth daily.  ? triamcinolone (KENALOG) 0.025 % ointment Apply 1 application topically 3 (three) times daily as needed.  ?  ? ?Allergies:   Penicillins and Penicillins  ? ?Social History  ? ?Socioeconomic History  ? Marital status: Single  ?  Spouse name: Not on file  ? Number of children: Not on file  ? Years of education: Not on file  ? Highest education level: Not on file  ?Occupational History  ? Not on file  ?Tobacco Use  ? Smoking status: Never  ? Smokeless tobacco:  Never  ?Vaping Use  ? Vaping Use: Never used  ?Substance and Sexual Activity  ? Alcohol use: Never  ? Drug use: Never  ? Sexual activity: Yes  ?  Birth control/protection: Surgical  ?Other Topics Concern  ? Not on file  ?Social History Narrative  ? ** Merged History Encounter **  ?    ? ?Social Determinants of Health  ? ?Financial Resource Strain: Not on file  ?Food Insecurity: Not on file  ?Transportation Needs: Not on file  ?Physical Activity: Not on file  ?Stress: Not on file  ?Social Connections: Not on file  ?  ? ?Family History: ?The patient's family history includes Colon polyps in her father; Hypertension in her brother, mother, and sister. There is no history of Colon cancer, Esophageal cancer, Rectal cancer, or Stomach cancer. ? ?ROS:   ?Please see the history of present illness.    ?All other systems reviewed  and are negative. ? ?EKGs/Labs/Other Studies Reviewed:   ? ?The following studies were reviewed today: ? ?Recent Labs: ?No results found for requested labs within last 8760 hours.  ?Recent Lipid Panel ?No results found for: CHOL, TRIG, HDL, CHOLHDL, VLDL, LDLCALC, LDLDIRECT ?    ? ?Physical Exam:   ? ?VS:  BP 130/70   Pulse 72   Ht 5' 1"  (1.549 m)   Wt 187 lb 6.4 oz (85 kg)   SpO2 97%   BMI 35.41 kg/m?    ? ?Wt Readings from Last 3 Encounters:  ?06/03/21 187 lb 6.4 oz (85 kg)  ?05/10/21 176 lb (79.8 kg)  ?02/17/20 176 lb (79.8 kg)  ?  ? ?GEN:  Well nourished, well developed in no acute distress ?HEENT: Normal ?NECK: No JVD; ?CARDIAC: RRR, no murmurs, rubs, gallops ?RESPIRATORY:  Clear to auscultation without rales, wheezing or rhonchi  ?ABDOMEN: Soft, non-tender, non-distended ?MUSCULOSKELETAL:  +1 bilateral edema; No deformity  ?SKIN: Warm and dry ?NEUROLOGIC:  Alert and oriented x 3 ?PSYCHIATRIC:  Normal affect  ? ?ASSESSMENT:   ? ?1. Chest pain of uncertain etiology   ?2. Chest pain, unspecified type   ?3. Essential hypertension   ?4. Mixed hyperlipidemia   ?5. Leg edema   ? ?PLAN:   ? ?Chest pain ?HTN ?HLD ?- lasix is a PRN only ?- continue statin HCTZ (ran out of meds and had LE edema) ?- will do stress test ?- if negative will get PRN f/u ? ?   ? ? ? ? ?Medication Adjustments/Labs and Tests Ordered: ?Current medicines are reviewed at length with the patient today.  Concerns regarding medicines are outlined above.  ?Orders Placed This Encounter  ?Procedures  ? NM Myocar Multi W/Spect W/Wall Motion / EF  ? ?No orders of the defined types were placed in this encounter. ? ? ?Patient Instructions  ?Testing/Procedures: ?Your physician has requested that you have a Exercise Stress Test. For further information please visit [email or web address]. Please follow instruction sheet, as given. ? ? ?Follow-Up: ?Follow up with Dr. Gasper Sells as needed.  ? ?Any Other Special Instructions Will Be Listed Below (If  Applicable). ? ? ? ? ?If you need a refill on your cardiac medications before your next appointment, please call your pharmacy. ?  ? ?Signed, ?Werner Lean, MD  ?06/03/2021 2:28 PM    ?[place] ? ?

## 2021-06-10 ENCOUNTER — Encounter (HOSPITAL_COMMUNITY)
Admission: RE | Admit: 2021-06-10 | Discharge: 2021-06-10 | Disposition: A | Discharge status: Home / Self Care | Payer: 59 | Source: Ambulatory Visit | Attending: Internal Medicine | Admitting: Internal Medicine

## 2021-06-10 ENCOUNTER — Telehealth: Payer: Self-pay | Admitting: Internal Medicine

## 2021-06-10 ENCOUNTER — Ambulatory Visit (HOSPITAL_COMMUNITY)
Admission: RE | Admit: 2021-06-10 | Discharge: 2021-06-10 | Disposition: A | Discharge status: Home / Self Care | Payer: 59 | Source: Ambulatory Visit | Attending: Internal Medicine | Admitting: Internal Medicine

## 2021-06-10 ENCOUNTER — Encounter (HOSPITAL_COMMUNITY): Payer: Self-pay

## 2021-06-10 DIAGNOSIS — R079 Chest pain, unspecified: Secondary | ICD-10-CM | POA: Insufficient documentation

## 2021-06-10 LAB — NM MYOCAR MULTI W/SPECT W/WALL MOTION / EF
Angina Index: 0
Duke Treadmill Score: 7
Estimated workload: 13
Exercise duration (min): 7 min
Exercise duration (sec): 6 s
LV dias vol: 64 mL (ref 46–106)
LV sys vol: 17 mL
MPHR: 167 {beats}/min
Nuc Stress EF: 73 %
Peak HR: 148 {beats}/min
Percent HR: 88 %
RATE: 0.4
RPE: 13
Rest HR: 64 {beats}/min
Rest Nuclear Isotope Dose: 10.1 mCi
SDS: 0
SRS: 0
SSS: 0
ST Depression (mm): 0 mm
Stress Nuclear Isotope Dose: 33 mCi
TID: 1.01

## 2021-06-10 MED ORDER — SODIUM CHLORIDE FLUSH 0.9 % IV SOLN
INTRAVENOUS | Status: AC
Start: 2021-06-10 — End: 2021-06-10
  Administered 2021-06-10: 10 mL via INTRAVENOUS
  Filled 2021-06-10: qty 10

## 2021-06-10 MED ORDER — REGADENOSON 0.4 MG/5ML IV SOLN
INTRAVENOUS | Status: AC
  Filled 2021-06-10: qty 5

## 2021-06-10 MED ORDER — TECHNETIUM TC 99M TETROFOSMIN IV KIT
30.0000 | PACK | Freq: Once | INTRAVENOUS | Status: AC | PRN
  Administered 2021-06-10: 33 via INTRAVENOUS

## 2021-06-10 MED ORDER — TECHNETIUM TC 99M TETROFOSMIN IV KIT
10.0000 | PACK | Freq: Once | INTRAVENOUS | Status: AC | PRN
  Administered 2021-06-10: 10.1 via INTRAVENOUS

## 2021-06-10 NOTE — Telephone Encounter (Signed)
Patient returning call for stress test results. 

## 2021-06-10 NOTE — Telephone Encounter (Signed)
Spoke with Pt who states she has viewed stress test results on MyChart and does not have any other questions at this time.  ?

## 2021-06-15 ENCOUNTER — Other Ambulatory Visit (HOSPITAL_COMMUNITY): Payer: Self-pay

## 2021-06-15 MED ORDER — PHENTERMINE HCL 37.5 MG PO TABS
37.5000 mg | ORAL_TABLET | Freq: Every day | ORAL | 2 refills | Status: DC
  Filled 2021-06-15: qty 30, 30d supply, fill #0
  Filled 2021-07-18: qty 30, 30d supply, fill #1
  Filled 2021-08-17: qty 30, 30d supply, fill #2

## 2021-07-18 ENCOUNTER — Other Ambulatory Visit (HOSPITAL_COMMUNITY): Payer: Self-pay

## 2021-07-29 ENCOUNTER — Other Ambulatory Visit (HOSPITAL_COMMUNITY): Payer: Self-pay

## 2021-08-17 ENCOUNTER — Other Ambulatory Visit (HOSPITAL_COMMUNITY): Payer: Self-pay

## 2021-08-18 ENCOUNTER — Other Ambulatory Visit (HOSPITAL_COMMUNITY): Payer: Self-pay

## 2021-08-18 MED ORDER — HYDROCORTISONE (PERIANAL) 2.5 % EX CREA
1.0000 | TOPICAL_CREAM | Freq: Two times a day (BID) | CUTANEOUS | 6 refills | Status: DC
Start: 2021-08-18 — End: 2022-01-31
  Filled 2021-08-18: qty 30, 10d supply, fill #0
  Filled 2021-08-23 – 2021-11-02 (×2): qty 30, 10d supply, fill #1
  Filled 2021-11-15: qty 30, 10d supply, fill #2
  Filled 2021-11-28 – 2022-01-29 (×3): qty 30, 10d supply, fill #3

## 2021-08-22 ENCOUNTER — Other Ambulatory Visit (HOSPITAL_COMMUNITY): Payer: Self-pay

## 2021-08-23 ENCOUNTER — Other Ambulatory Visit (HOSPITAL_COMMUNITY): Payer: Self-pay

## 2021-08-26 ENCOUNTER — Other Ambulatory Visit (HOSPITAL_COMMUNITY): Payer: Self-pay

## 2021-08-29 ENCOUNTER — Other Ambulatory Visit (HOSPITAL_COMMUNITY): Payer: Self-pay

## 2021-09-28 ENCOUNTER — Other Ambulatory Visit (HOSPITAL_COMMUNITY): Payer: Self-pay

## 2021-10-31 ENCOUNTER — Other Ambulatory Visit (HOSPITAL_COMMUNITY): Payer: Self-pay

## 2021-11-02 ENCOUNTER — Other Ambulatory Visit (HOSPITAL_COMMUNITY): Payer: Self-pay

## 2021-11-02 DIAGNOSIS — I10 Essential (primary) hypertension: Secondary | ICD-10-CM | POA: Diagnosis not present

## 2021-11-02 DIAGNOSIS — N951 Menopausal and female climacteric states: Secondary | ICD-10-CM | POA: Diagnosis not present

## 2021-11-02 DIAGNOSIS — Z6833 Body mass index (BMI) 33.0-33.9, adult: Secondary | ICD-10-CM | POA: Diagnosis not present

## 2021-11-02 DIAGNOSIS — Z0000 Encounter for general adult medical examination without abnormal findings: Secondary | ICD-10-CM | POA: Diagnosis not present

## 2021-11-02 DIAGNOSIS — K5901 Slow transit constipation: Secondary | ICD-10-CM | POA: Diagnosis not present

## 2021-11-02 DIAGNOSIS — F411 Generalized anxiety disorder: Secondary | ICD-10-CM | POA: Diagnosis not present

## 2021-11-02 DIAGNOSIS — E7849 Other hyperlipidemia: Secondary | ICD-10-CM | POA: Diagnosis not present

## 2021-11-02 MED ORDER — ALPRAZOLAM 0.5 MG PO TABS
0.5000 mg | ORAL_TABLET | Freq: Two times a day (BID) | ORAL | 1 refills | Status: DC | PRN
  Filled 2021-11-02: qty 180, 90d supply, fill #1
  Filled 2021-11-02: qty 180, 90d supply, fill #0
  Filled 2022-04-23: qty 180, 90d supply, fill #1

## 2021-11-02 MED ORDER — PHENTERMINE HCL 37.5 MG PO TABS
37.5000 mg | ORAL_TABLET | Freq: Every day | ORAL | 2 refills | Status: DC
  Filled 2021-11-02: qty 30, 30d supply, fill #1
  Filled 2021-11-02: qty 30, 30d supply, fill #0
  Filled 2021-11-28 – 2021-12-08 (×2): qty 30, 30d supply, fill #1
  Filled 2022-01-16: qty 30, 30d supply, fill #2

## 2021-11-02 MED ORDER — PREMARIN 0.625 MG PO TABS
0.6250 mg | ORAL_TABLET | Freq: Every day | ORAL | 1 refills | Status: DC
  Filled 2021-11-02: qty 90, 90d supply, fill #1
  Filled 2021-11-02: qty 90, 90d supply, fill #0
  Filled 2022-02-23: qty 90, 90d supply, fill #1

## 2021-11-03 ENCOUNTER — Other Ambulatory Visit (HOSPITAL_COMMUNITY): Payer: Self-pay

## 2021-11-10 DIAGNOSIS — Z1329 Encounter for screening for other suspected endocrine disorder: Secondary | ICD-10-CM | POA: Diagnosis not present

## 2021-11-10 DIAGNOSIS — R5383 Other fatigue: Secondary | ICD-10-CM | POA: Diagnosis not present

## 2021-11-10 DIAGNOSIS — I10 Essential (primary) hypertension: Secondary | ICD-10-CM | POA: Diagnosis not present

## 2021-11-10 DIAGNOSIS — E7849 Other hyperlipidemia: Secondary | ICD-10-CM | POA: Diagnosis not present

## 2021-11-15 ENCOUNTER — Other Ambulatory Visit (HOSPITAL_COMMUNITY): Payer: Self-pay

## 2021-11-28 ENCOUNTER — Other Ambulatory Visit (HOSPITAL_COMMUNITY): Payer: Self-pay

## 2021-12-02 DIAGNOSIS — E785 Hyperlipidemia, unspecified: Secondary | ICD-10-CM | POA: Diagnosis not present

## 2021-12-02 DIAGNOSIS — I10 Essential (primary) hypertension: Secondary | ICD-10-CM | POA: Diagnosis not present

## 2021-12-02 DIAGNOSIS — Z0000 Encounter for general adult medical examination without abnormal findings: Secondary | ICD-10-CM | POA: Diagnosis not present

## 2021-12-04 DIAGNOSIS — I10 Essential (primary) hypertension: Secondary | ICD-10-CM | POA: Diagnosis not present

## 2021-12-04 DIAGNOSIS — R030 Elevated blood-pressure reading, without diagnosis of hypertension: Secondary | ICD-10-CM | POA: Diagnosis not present

## 2021-12-04 DIAGNOSIS — L03113 Cellulitis of right upper limb: Secondary | ICD-10-CM | POA: Diagnosis not present

## 2021-12-04 DIAGNOSIS — E669 Obesity, unspecified: Secondary | ICD-10-CM | POA: Diagnosis not present

## 2021-12-04 DIAGNOSIS — S50861A Insect bite (nonvenomous) of right forearm, initial encounter: Secondary | ICD-10-CM | POA: Diagnosis not present

## 2021-12-04 DIAGNOSIS — Z6833 Body mass index (BMI) 33.0-33.9, adult: Secondary | ICD-10-CM | POA: Diagnosis not present

## 2021-12-09 ENCOUNTER — Other Ambulatory Visit (HOSPITAL_COMMUNITY): Payer: Self-pay

## 2021-12-30 DIAGNOSIS — H524 Presbyopia: Secondary | ICD-10-CM | POA: Diagnosis not present

## 2021-12-30 DIAGNOSIS — H52221 Regular astigmatism, right eye: Secondary | ICD-10-CM | POA: Diagnosis not present

## 2021-12-30 DIAGNOSIS — H5203 Hypermetropia, bilateral: Secondary | ICD-10-CM | POA: Diagnosis not present

## 2022-01-16 ENCOUNTER — Other Ambulatory Visit (HOSPITAL_COMMUNITY): Payer: Self-pay

## 2022-01-30 ENCOUNTER — Other Ambulatory Visit (HOSPITAL_COMMUNITY): Payer: Self-pay

## 2022-01-31 ENCOUNTER — Other Ambulatory Visit (HOSPITAL_COMMUNITY): Payer: Self-pay

## 2022-01-31 MED ORDER — HYDROCORTISONE (PERIANAL) 2.5 % EX CREA
1.0000 | TOPICAL_CREAM | Freq: Two times a day (BID) | CUTANEOUS | 6 refills | Status: DC
  Filled 2022-01-31: qty 30, 15d supply, fill #0
  Filled 2022-02-23: qty 30, 15d supply, fill #1
  Filled 2022-04-23 – 2022-04-24 (×2): qty 30, 15d supply, fill #2
  Filled 2022-05-09: qty 15, 5d supply, fill #3

## 2022-02-01 ENCOUNTER — Other Ambulatory Visit (HOSPITAL_COMMUNITY): Payer: Self-pay

## 2022-02-14 ENCOUNTER — Other Ambulatory Visit (HOSPITAL_COMMUNITY): Payer: Self-pay

## 2022-02-17 ENCOUNTER — Other Ambulatory Visit (HOSPITAL_COMMUNITY): Payer: Self-pay

## 2022-02-20 DIAGNOSIS — Z20828 Contact with and (suspected) exposure to other viral communicable diseases: Secondary | ICD-10-CM | POA: Diagnosis not present

## 2022-02-23 ENCOUNTER — Other Ambulatory Visit: Payer: Self-pay

## 2022-02-23 ENCOUNTER — Other Ambulatory Visit (HOSPITAL_COMMUNITY): Payer: Self-pay

## 2022-02-28 ENCOUNTER — Other Ambulatory Visit (HOSPITAL_COMMUNITY): Payer: Self-pay

## 2022-03-01 DIAGNOSIS — Z6833 Body mass index (BMI) 33.0-33.9, adult: Secondary | ICD-10-CM | POA: Diagnosis not present

## 2022-03-01 DIAGNOSIS — J0101 Acute recurrent maxillary sinusitis: Secondary | ICD-10-CM | POA: Diagnosis not present

## 2022-03-01 DIAGNOSIS — R5383 Other fatigue: Secondary | ICD-10-CM | POA: Diagnosis not present

## 2022-03-01 DIAGNOSIS — R030 Elevated blood-pressure reading, without diagnosis of hypertension: Secondary | ICD-10-CM | POA: Diagnosis not present

## 2022-04-12 ENCOUNTER — Other Ambulatory Visit (HOSPITAL_COMMUNITY): Payer: Self-pay

## 2022-04-12 MED ORDER — FUROSEMIDE 40 MG PO TABS
40.0000 mg | ORAL_TABLET | Freq: Every day | ORAL | 0 refills | Status: DC
  Filled 2022-04-12: qty 90, 90d supply, fill #0

## 2022-04-23 ENCOUNTER — Other Ambulatory Visit (HOSPITAL_COMMUNITY): Payer: Self-pay

## 2022-04-24 ENCOUNTER — Other Ambulatory Visit: Payer: Self-pay

## 2022-04-24 ENCOUNTER — Other Ambulatory Visit (HOSPITAL_COMMUNITY): Payer: Self-pay

## 2022-04-24 MED ORDER — PHENTERMINE HCL 37.5 MG PO TABS
37.5000 mg | ORAL_TABLET | Freq: Every day | ORAL | 0 refills | Status: DC
  Filled 2022-04-24: qty 30, 30d supply, fill #0

## 2022-04-25 ENCOUNTER — Other Ambulatory Visit: Payer: Self-pay

## 2022-04-28 ENCOUNTER — Other Ambulatory Visit (HOSPITAL_COMMUNITY): Payer: Self-pay

## 2022-04-28 MED ORDER — PREMARIN 0.625 MG PO TABS
0.6250 mg | ORAL_TABLET | Freq: Every day | ORAL | 1 refills | Status: DC
  Filled 2022-04-28 – 2022-05-20 (×2): qty 90, 90d supply, fill #0
  Filled 2022-08-17: qty 90, 90d supply, fill #1

## 2022-05-03 ENCOUNTER — Other Ambulatory Visit: Payer: Self-pay

## 2022-05-03 ENCOUNTER — Other Ambulatory Visit (HOSPITAL_COMMUNITY): Payer: Self-pay

## 2022-05-05 DIAGNOSIS — E782 Mixed hyperlipidemia: Secondary | ICD-10-CM | POA: Diagnosis not present

## 2022-05-05 DIAGNOSIS — R739 Hyperglycemia, unspecified: Secondary | ICD-10-CM | POA: Diagnosis not present

## 2022-05-05 DIAGNOSIS — R5383 Other fatigue: Secondary | ICD-10-CM | POA: Diagnosis not present

## 2022-05-05 DIAGNOSIS — I10 Essential (primary) hypertension: Secondary | ICD-10-CM | POA: Diagnosis not present

## 2022-05-05 DIAGNOSIS — D649 Anemia, unspecified: Secondary | ICD-10-CM | POA: Diagnosis not present

## 2022-05-09 ENCOUNTER — Other Ambulatory Visit (HOSPITAL_COMMUNITY): Payer: Self-pay

## 2022-05-09 MED ORDER — HYDROCORTISONE (PERIANAL) 2.5 % EX CREA
1.0000 | TOPICAL_CREAM | Freq: Two times a day (BID) | CUTANEOUS | 6 refills | Status: DC
  Filled 2022-05-09: qty 30, 15d supply, fill #0
  Filled 2022-05-09 – 2022-05-12 (×2): qty 30, 10d supply, fill #0
  Filled 2022-05-20: qty 30, 10d supply, fill #1
  Filled 2022-07-06: qty 30, 10d supply, fill #2
  Filled 2022-08-17: qty 30, 10d supply, fill #3
  Filled 2023-01-18: qty 30, 10d supply, fill #4
  Filled 2023-03-13: qty 30, 10d supply, fill #5
  Filled 2023-04-18: qty 30, 10d supply, fill #6

## 2022-05-10 ENCOUNTER — Other Ambulatory Visit (HOSPITAL_COMMUNITY): Payer: Self-pay

## 2022-05-12 ENCOUNTER — Other Ambulatory Visit (HOSPITAL_COMMUNITY): Payer: Self-pay

## 2022-05-12 ENCOUNTER — Other Ambulatory Visit: Payer: Self-pay

## 2022-05-15 ENCOUNTER — Other Ambulatory Visit (HOSPITAL_COMMUNITY): Payer: Self-pay

## 2022-05-16 ENCOUNTER — Other Ambulatory Visit (HOSPITAL_COMMUNITY): Payer: Self-pay

## 2022-05-20 ENCOUNTER — Other Ambulatory Visit (HOSPITAL_COMMUNITY): Payer: Self-pay

## 2022-05-22 ENCOUNTER — Other Ambulatory Visit: Payer: Self-pay

## 2022-05-22 ENCOUNTER — Other Ambulatory Visit (HOSPITAL_COMMUNITY): Payer: Self-pay

## 2022-05-22 MED ORDER — LINACLOTIDE 145 MCG PO CAPS
145.0000 ug | ORAL_CAPSULE | Freq: Every day | ORAL | 3 refills | Status: DC
  Filled 2022-05-22: qty 90, 90d supply, fill #0
  Filled 2022-09-19: qty 90, 90d supply, fill #1
  Filled 2022-12-09 – 2022-12-14 (×3): qty 90, 90d supply, fill #2
  Filled 2023-03-13: qty 90, 90d supply, fill #3

## 2022-05-22 MED ORDER — ATORVASTATIN CALCIUM 20 MG PO TABS
20.0000 mg | ORAL_TABLET | Freq: Every day | ORAL | 3 refills | Status: DC
  Filled 2022-05-22: qty 90, 90d supply, fill #0
  Filled 2022-10-13: qty 90, 90d supply, fill #1
  Filled 2023-01-18: qty 90, 90d supply, fill #2
  Filled 2023-04-18: qty 90, 90d supply, fill #3

## 2022-05-22 MED ORDER — PHENTERMINE HCL 37.5 MG PO TABS
37.5000 mg | ORAL_TABLET | Freq: Every day | ORAL | 0 refills | Status: DC
  Filled 2022-05-22: qty 30, 30d supply, fill #0

## 2022-05-23 ENCOUNTER — Other Ambulatory Visit (HOSPITAL_COMMUNITY): Payer: Self-pay

## 2022-05-23 ENCOUNTER — Other Ambulatory Visit: Payer: Self-pay

## 2022-05-26 ENCOUNTER — Other Ambulatory Visit (HOSPITAL_COMMUNITY): Payer: Self-pay

## 2022-05-26 MED ORDER — LOSARTAN POTASSIUM 100 MG PO TABS
50.0000 mg | ORAL_TABLET | Freq: Two times a day (BID) | ORAL | 3 refills | Status: DC
  Filled 2022-05-26 – 2022-05-29 (×2): qty 90, 90d supply, fill #0
  Filled 2023-01-18: qty 90, 90d supply, fill #1
  Filled 2023-04-18: qty 90, 90d supply, fill #2

## 2022-05-29 ENCOUNTER — Other Ambulatory Visit (HOSPITAL_COMMUNITY): Payer: Self-pay

## 2022-07-06 ENCOUNTER — Encounter: Payer: Self-pay | Admitting: Pharmacist

## 2022-07-06 ENCOUNTER — Other Ambulatory Visit (HOSPITAL_COMMUNITY): Payer: Self-pay

## 2022-07-06 ENCOUNTER — Other Ambulatory Visit: Payer: Self-pay

## 2022-07-06 MED ORDER — PHENTERMINE HCL 37.5 MG PO TABS
37.5000 mg | ORAL_TABLET | Freq: Every day | ORAL | 0 refills | Status: DC
  Filled 2022-07-06 – 2022-07-11 (×2): qty 30, 30d supply, fill #0

## 2022-07-06 MED ORDER — FUROSEMIDE 40 MG PO TABS
40.0000 mg | ORAL_TABLET | Freq: Every day | ORAL | 0 refills | Status: DC
  Filled 2022-07-06: qty 90, 90d supply, fill #0

## 2022-07-11 ENCOUNTER — Other Ambulatory Visit (HOSPITAL_COMMUNITY): Payer: Self-pay

## 2022-07-12 ENCOUNTER — Other Ambulatory Visit: Payer: Self-pay

## 2022-07-13 ENCOUNTER — Other Ambulatory Visit (HOSPITAL_COMMUNITY): Payer: Self-pay

## 2022-07-13 ENCOUNTER — Other Ambulatory Visit: Payer: Self-pay

## 2022-07-13 ENCOUNTER — Encounter (HOSPITAL_COMMUNITY): Payer: Self-pay

## 2022-07-14 ENCOUNTER — Other Ambulatory Visit (HOSPITAL_COMMUNITY): Payer: Self-pay

## 2022-08-17 ENCOUNTER — Other Ambulatory Visit: Payer: Self-pay

## 2022-08-17 ENCOUNTER — Other Ambulatory Visit (HOSPITAL_COMMUNITY): Payer: Self-pay

## 2022-08-17 ENCOUNTER — Encounter (HOSPITAL_COMMUNITY): Payer: Self-pay

## 2022-08-18 ENCOUNTER — Other Ambulatory Visit (HOSPITAL_COMMUNITY): Payer: Self-pay

## 2022-08-18 MED ORDER — PREMARIN 0.625 MG PO TABS
0.6250 mg | ORAL_TABLET | Freq: Every day | ORAL | 0 refills | Status: DC
  Filled 2022-08-18 – 2022-12-16 (×3): qty 90, 90d supply, fill #0

## 2022-08-19 ENCOUNTER — Other Ambulatory Visit (HOSPITAL_COMMUNITY): Payer: Self-pay

## 2022-08-21 ENCOUNTER — Other Ambulatory Visit (HOSPITAL_COMMUNITY): Payer: Self-pay

## 2022-08-25 ENCOUNTER — Other Ambulatory Visit (HOSPITAL_COMMUNITY): Payer: Self-pay

## 2022-08-25 DIAGNOSIS — R600 Localized edema: Secondary | ICD-10-CM | POA: Diagnosis not present

## 2022-08-25 DIAGNOSIS — R739 Hyperglycemia, unspecified: Secondary | ICD-10-CM | POA: Diagnosis not present

## 2022-08-25 DIAGNOSIS — R030 Elevated blood-pressure reading, without diagnosis of hypertension: Secondary | ICD-10-CM | POA: Diagnosis not present

## 2022-08-25 DIAGNOSIS — F419 Anxiety disorder, unspecified: Secondary | ICD-10-CM | POA: Diagnosis not present

## 2022-08-25 DIAGNOSIS — I10 Essential (primary) hypertension: Secondary | ICD-10-CM | POA: Diagnosis not present

## 2022-08-25 DIAGNOSIS — Z6832 Body mass index (BMI) 32.0-32.9, adult: Secondary | ICD-10-CM | POA: Diagnosis not present

## 2022-08-25 MED ORDER — PHENTERMINE HCL 37.5 MG PO TABS
37.5000 mg | ORAL_TABLET | Freq: Every day | ORAL | 0 refills | Status: DC
  Filled 2022-08-25 – 2022-09-01 (×2): qty 30, 30d supply, fill #0

## 2022-08-25 MED ORDER — FUROSEMIDE 40 MG PO TABS
40.0000 mg | ORAL_TABLET | Freq: Every day | ORAL | 3 refills | Status: DC
  Filled 2023-01-18: qty 90, 90d supply, fill #0
  Filled 2023-04-18: qty 90, 90d supply, fill #1
  Filled 2023-08-25: qty 90, 90d supply, fill #2

## 2022-08-28 ENCOUNTER — Other Ambulatory Visit: Payer: Self-pay

## 2022-08-29 ENCOUNTER — Other Ambulatory Visit (HOSPITAL_COMMUNITY): Payer: Self-pay

## 2022-08-30 ENCOUNTER — Other Ambulatory Visit (HOSPITAL_COMMUNITY): Payer: Self-pay

## 2022-08-31 ENCOUNTER — Other Ambulatory Visit (HOSPITAL_COMMUNITY): Payer: Self-pay

## 2022-08-31 ENCOUNTER — Other Ambulatory Visit: Payer: Self-pay

## 2022-08-31 DIAGNOSIS — R519 Headache, unspecified: Secondary | ICD-10-CM | POA: Diagnosis not present

## 2022-08-31 DIAGNOSIS — Z6833 Body mass index (BMI) 33.0-33.9, adult: Secondary | ICD-10-CM | POA: Diagnosis not present

## 2022-08-31 DIAGNOSIS — I10 Essential (primary) hypertension: Secondary | ICD-10-CM | POA: Diagnosis not present

## 2022-08-31 DIAGNOSIS — R112 Nausea with vomiting, unspecified: Secondary | ICD-10-CM | POA: Diagnosis not present

## 2022-08-31 DIAGNOSIS — R030 Elevated blood-pressure reading, without diagnosis of hypertension: Secondary | ICD-10-CM | POA: Diagnosis not present

## 2022-08-31 MED ORDER — HYDROCHLOROTHIAZIDE 25 MG PO TABS
25.0000 mg | ORAL_TABLET | Freq: Every day | ORAL | 3 refills | Status: DC
  Filled 2022-08-31: qty 90, 90d supply, fill #0
  Filled 2022-11-25: qty 90, 90d supply, fill #1

## 2022-09-01 ENCOUNTER — Other Ambulatory Visit (HOSPITAL_COMMUNITY): Payer: Self-pay

## 2022-09-19 ENCOUNTER — Other Ambulatory Visit: Payer: Self-pay

## 2022-10-13 ENCOUNTER — Other Ambulatory Visit: Payer: Self-pay

## 2022-10-20 ENCOUNTER — Other Ambulatory Visit (HOSPITAL_COMMUNITY): Payer: Self-pay

## 2022-10-20 DIAGNOSIS — K5901 Slow transit constipation: Secondary | ICD-10-CM | POA: Diagnosis not present

## 2022-10-20 DIAGNOSIS — R739 Hyperglycemia, unspecified: Secondary | ICD-10-CM | POA: Diagnosis not present

## 2022-10-20 DIAGNOSIS — I10 Essential (primary) hypertension: Secondary | ICD-10-CM | POA: Diagnosis not present

## 2022-10-20 DIAGNOSIS — R5383 Other fatigue: Secondary | ICD-10-CM | POA: Diagnosis not present

## 2022-10-20 DIAGNOSIS — F411 Generalized anxiety disorder: Secondary | ICD-10-CM | POA: Diagnosis not present

## 2022-10-20 DIAGNOSIS — E782 Mixed hyperlipidemia: Secondary | ICD-10-CM | POA: Diagnosis not present

## 2022-10-23 ENCOUNTER — Other Ambulatory Visit (HOSPITAL_COMMUNITY): Payer: Self-pay

## 2022-10-23 MED ORDER — ALPRAZOLAM 0.5 MG PO TABS
0.5000 mg | ORAL_TABLET | Freq: Two times a day (BID) | ORAL | 1 refills | Status: DC | PRN
  Filled 2022-10-23 – 2022-10-25 (×2): qty 180, 90d supply, fill #0
  Filled 2023-03-21: qty 180, 90d supply, fill #1

## 2022-10-25 ENCOUNTER — Other Ambulatory Visit (HOSPITAL_COMMUNITY): Payer: Self-pay

## 2022-10-25 ENCOUNTER — Other Ambulatory Visit (HOSPITAL_COMMUNITY): Payer: Self-pay | Admitting: Family Medicine

## 2022-10-25 DIAGNOSIS — Z1231 Encounter for screening mammogram for malignant neoplasm of breast: Secondary | ICD-10-CM

## 2022-10-25 DIAGNOSIS — Z0000 Encounter for general adult medical examination without abnormal findings: Secondary | ICD-10-CM | POA: Diagnosis not present

## 2022-10-25 DIAGNOSIS — K5900 Constipation, unspecified: Secondary | ICD-10-CM | POA: Diagnosis not present

## 2022-10-25 DIAGNOSIS — R600 Localized edema: Secondary | ICD-10-CM | POA: Diagnosis not present

## 2022-10-25 DIAGNOSIS — I10 Essential (primary) hypertension: Secondary | ICD-10-CM | POA: Diagnosis not present

## 2022-10-25 DIAGNOSIS — E7849 Other hyperlipidemia: Secondary | ICD-10-CM | POA: Diagnosis not present

## 2022-10-25 DIAGNOSIS — Z6832 Body mass index (BMI) 32.0-32.9, adult: Secondary | ICD-10-CM | POA: Diagnosis not present

## 2022-10-25 DIAGNOSIS — R739 Hyperglycemia, unspecified: Secondary | ICD-10-CM | POA: Diagnosis not present

## 2022-10-25 DIAGNOSIS — F411 Generalized anxiety disorder: Secondary | ICD-10-CM | POA: Diagnosis not present

## 2022-10-25 MED ORDER — PHENTERMINE HCL 37.5 MG PO TABS
37.5000 mg | ORAL_TABLET | Freq: Every day | ORAL | 2 refills | Status: DC
  Filled 2022-10-25: qty 30, 30d supply, fill #0
  Filled 2023-01-06: qty 30, 30d supply, fill #1
  Filled 2023-03-21: qty 30, 30d supply, fill #2

## 2022-10-25 MED ORDER — POTASSIUM CHLORIDE CRYS ER 20 MEQ PO TBCR
20.0000 meq | EXTENDED_RELEASE_TABLET | Freq: Two times a day (BID) | ORAL | 5 refills | Status: DC
  Filled 2022-10-25: qty 180, 90d supply, fill #0
  Filled 2023-01-18: qty 180, 90d supply, fill #1
  Filled 2023-08-25: qty 180, 90d supply, fill #2

## 2022-11-25 ENCOUNTER — Other Ambulatory Visit (HOSPITAL_COMMUNITY): Payer: Self-pay

## 2022-12-02 ENCOUNTER — Other Ambulatory Visit (HOSPITAL_COMMUNITY): Payer: Self-pay

## 2022-12-09 ENCOUNTER — Other Ambulatory Visit (HOSPITAL_COMMUNITY): Payer: Self-pay

## 2022-12-14 ENCOUNTER — Other Ambulatory Visit (HOSPITAL_COMMUNITY): Payer: Self-pay

## 2022-12-15 ENCOUNTER — Other Ambulatory Visit: Payer: Self-pay

## 2022-12-16 ENCOUNTER — Other Ambulatory Visit (HOSPITAL_COMMUNITY): Payer: Self-pay

## 2022-12-18 ENCOUNTER — Other Ambulatory Visit (HOSPITAL_COMMUNITY): Payer: Self-pay

## 2022-12-18 ENCOUNTER — Other Ambulatory Visit: Payer: Self-pay

## 2022-12-18 ENCOUNTER — Encounter: Payer: Self-pay | Admitting: Pharmacist

## 2023-01-06 ENCOUNTER — Other Ambulatory Visit (HOSPITAL_COMMUNITY): Payer: Self-pay

## 2023-01-09 DIAGNOSIS — Z6833 Body mass index (BMI) 33.0-33.9, adult: Secondary | ICD-10-CM | POA: Diagnosis not present

## 2023-01-09 DIAGNOSIS — R030 Elevated blood-pressure reading, without diagnosis of hypertension: Secondary | ICD-10-CM | POA: Diagnosis not present

## 2023-01-09 DIAGNOSIS — M7711 Lateral epicondylitis, right elbow: Secondary | ICD-10-CM | POA: Diagnosis not present

## 2023-01-19 ENCOUNTER — Other Ambulatory Visit (HOSPITAL_COMMUNITY): Payer: Self-pay

## 2023-01-20 ENCOUNTER — Other Ambulatory Visit (HOSPITAL_COMMUNITY): Payer: Self-pay

## 2023-02-14 ENCOUNTER — Encounter: Payer: Self-pay | Admitting: Gastroenterology

## 2023-02-14 MED ORDER — AZITHROMYCIN 250 MG PO TABS: ORAL_TABLET | ORAL | 0 refills | Status: DC

## 2023-03-14 ENCOUNTER — Other Ambulatory Visit (HOSPITAL_COMMUNITY): Payer: Self-pay

## 2023-03-15 DIAGNOSIS — I10 Essential (primary) hypertension: Secondary | ICD-10-CM | POA: Diagnosis not present

## 2023-03-22 ENCOUNTER — Other Ambulatory Visit: Payer: Self-pay

## 2023-03-22 ENCOUNTER — Other Ambulatory Visit (HOSPITAL_COMMUNITY): Payer: Self-pay

## 2023-03-23 ENCOUNTER — Other Ambulatory Visit (HOSPITAL_COMMUNITY): Payer: Self-pay

## 2023-03-23 ENCOUNTER — Other Ambulatory Visit: Payer: Self-pay

## 2023-04-18 ENCOUNTER — Other Ambulatory Visit: Payer: Self-pay

## 2023-04-18 ENCOUNTER — Other Ambulatory Visit (HOSPITAL_COMMUNITY): Payer: Self-pay

## 2023-04-18 MED ORDER — ESTROGENS CONJUGATED 0.625 MG PO TABS
0.6250 mg | ORAL_TABLET | Freq: Every day | ORAL | 1 refills | Status: DC
  Filled 2023-04-18: qty 90, 90d supply, fill #0
  Filled 2023-08-25: qty 90, 90d supply, fill #1

## 2023-04-23 ENCOUNTER — Other Ambulatory Visit (HOSPITAL_COMMUNITY): Payer: Self-pay

## 2023-04-23 ENCOUNTER — Other Ambulatory Visit: Payer: Self-pay

## 2023-04-23 DIAGNOSIS — R5383 Other fatigue: Secondary | ICD-10-CM | POA: Diagnosis not present

## 2023-04-23 DIAGNOSIS — N951 Menopausal and female climacteric states: Secondary | ICD-10-CM | POA: Diagnosis not present

## 2023-04-23 DIAGNOSIS — K5901 Slow transit constipation: Secondary | ICD-10-CM | POA: Diagnosis not present

## 2023-04-23 DIAGNOSIS — R739 Hyperglycemia, unspecified: Secondary | ICD-10-CM | POA: Diagnosis not present

## 2023-04-23 DIAGNOSIS — I10 Essential (primary) hypertension: Secondary | ICD-10-CM | POA: Diagnosis not present

## 2023-04-23 MED ORDER — ALPRAZOLAM 0.5 MG PO TABS
0.5000 mg | ORAL_TABLET | Freq: Every day | ORAL | 0 refills | Status: DC
  Filled 2023-07-24: qty 90, 90d supply, fill #0

## 2023-04-23 MED ORDER — LINZESS 290 MCG PO CAPS
290.0000 ug | ORAL_CAPSULE | Freq: Every day | ORAL | 0 refills | Status: DC
  Filled 2023-04-23: qty 90, 90d supply, fill #0

## 2023-04-23 MED ORDER — LOSARTAN POTASSIUM 50 MG PO TABS
50.0000 mg | ORAL_TABLET | Freq: Every day | ORAL | 0 refills | Status: DC
  Filled 2023-04-23: qty 90, 90d supply, fill #0

## 2023-04-27 ENCOUNTER — Other Ambulatory Visit (HOSPITAL_COMMUNITY): Payer: Self-pay | Admitting: Family Medicine

## 2023-04-27 ENCOUNTER — Ambulatory Visit (HOSPITAL_COMMUNITY)
Admission: RE | Admit: 2023-04-27 | Discharge: 2023-04-27 | Disposition: A | Discharge status: Home / Self Care | Payer: Commercial Managed Care - PPO | Source: Ambulatory Visit | Attending: Family Medicine | Admitting: Family Medicine

## 2023-04-27 DIAGNOSIS — Z1231 Encounter for screening mammogram for malignant neoplasm of breast: Secondary | ICD-10-CM | POA: Diagnosis not present

## 2023-04-27 DIAGNOSIS — R748 Abnormal levels of other serum enzymes: Secondary | ICD-10-CM

## 2023-05-01 ENCOUNTER — Other Ambulatory Visit: Payer: Self-pay

## 2023-05-11 ENCOUNTER — Other Ambulatory Visit (HOSPITAL_COMMUNITY): Payer: Self-pay

## 2023-05-11 MED ORDER — PHENTERMINE HCL 37.5 MG PO TABS
37.5000 mg | ORAL_TABLET | Freq: Every day | ORAL | 2 refills | Status: DC
  Filled 2023-05-11: qty 30, 30d supply, fill #0
  Filled 2023-08-25: qty 30, 30d supply, fill #1
  Filled 2023-10-16: qty 30, 30d supply, fill #2

## 2023-05-12 ENCOUNTER — Other Ambulatory Visit (HOSPITAL_COMMUNITY): Payer: Self-pay

## 2023-05-19 ENCOUNTER — Ambulatory Visit
Admission: EM | Admit: 2023-05-19 | Discharge: 2023-05-19 | Disposition: A | Discharge status: Home / Self Care | Attending: Nurse Practitioner | Admitting: Nurse Practitioner

## 2023-05-19 DIAGNOSIS — B349 Viral infection, unspecified: Secondary | ICD-10-CM | POA: Diagnosis not present

## 2023-05-19 DIAGNOSIS — R110 Nausea: Secondary | ICD-10-CM | POA: Diagnosis not present

## 2023-05-19 LAB — POCT INFLUENZA A/B
Influenza A, POC: NEGATIVE
Influenza B, POC: NEGATIVE

## 2023-05-19 MED ORDER — ONDANSETRON 4 MG PO TBDP
4.0000 mg | ORAL_TABLET | Freq: Once | ORAL | Status: AC
  Administered 2023-05-19: 4 mg via ORAL

## 2023-05-19 MED ORDER — PROMETHAZINE-DM 6.25-15 MG/5ML PO SYRP: 5.0000 mL | ORAL_SOLUTION | Freq: Four times a day (QID) | ORAL | 0 refills | Status: DC | PRN

## 2023-05-19 MED ORDER — ONDANSETRON 4 MG PO TBDP: 4.0000 mg | ORAL_TABLET | Freq: Three times a day (TID) | ORAL | 0 refills | Status: DC | PRN

## 2023-05-19 NOTE — Discharge Instructions (Signed)
 The influenza test was negative. Take medication as prescribed. Increase fluids.  As discussed, it is important for you to begin rehydrating yourself.  Recommend the use of Pedialyte or Gatorade to prevent dehydration. May take over-the-counter Tylenol or ibuprofen as needed for pain, fever, or general discomfort. Warm salt water gargles 3-4 times daily as needed for throat pain or discomfort. For the cough, recommend use of a humidifier in your bedroom at nighttime during sleep and sleeping elevated on pillows while cough symptoms persist. Symptoms should improve over the next 5 to 7 days.  If symptoms fail to improve, or appear to be worsening, you may follow-up in this clinic or with your primary care physician for further evaluation. Follow-up as needed.

## 2023-05-19 NOTE — ED Triage Notes (Signed)
 Pt reports she has chills, body aches, nausea, cough, and a sore throat x 6 days.    Took home covid test x 2 days ago/ neg  Pt wants flu testing

## 2023-05-19 NOTE — ED Provider Notes (Signed)
 [facility]    CSN: [id number] Arrival date & time: 05/19/23  1346      History   Chief Complaint No chief complaint on file.   HPI Latoya Williams is a 53 y.o. female.   The history is provided by the patient.   Patient presents for complaints of nausea, that started approximately 5 to 6 days ago, with new onset chills, body aches, cough, and sore throat that started earlier today.  Denies fever, headache, ear pain, wheezing, difficulty breathing, chest pain, abdominal pain, vomiting, or diarrhea.  Patient reports she has not been eating or drinking due to the persistent nausea.  States that she took 2 home COVID test, both were negative.  Past Medical History:  Diagnosis Date   Constipation    on Linzess with soft regular bowel movements    High cholesterol    Hypercholesterolemia    Hypertension     Patient Active Problem List   Diagnosis Date Noted   Chest pain of uncertain etiology 06/03/2021   Mixed hyperlipidemia 06/03/2021   Leg edema 06/03/2021   Essential hypertension    High cholesterol     Past Surgical History:  Procedure Laterality Date   ABDOMINAL HYSTERECTOMY     ABDOMINAL SURGERY     BONE MARROW BIOPSY     CHOLECYSTECTOMY     LAPAROSCOPIC OOPHERECTOMY     OOPHORECTOMY      OB History     Gravida  3   Para  2   Term  2   Preterm  0   AB  1   Living         SAB  1   IAB  0   Ectopic  0   Multiple      Live Births               Home Medications    Prior to Admission medications   Medication Sig Start Date End Date Taking? Authorizing Provider  ALPRAZolam Prudy Feeler) 0.5 MG tablet Take 0.5 mg by mouth at bedtime as needed for anxiety.    [provider]  ALPRAZolam Prudy Feeler) 0.5 MG tablet Take 1 tablet (0.5 mg total) by mouth 2 (two) times daily as needed. 10/23/22     ALPRAZolam (XANAX) 0.5 MG tablet Take 1 tablet (0.5 mg total) by mouth daily. 04/23/23     atorvastatin (LIPITOR) 20 MG tablet Take 1  tablet (20 mg total) by mouth daily. 05/22/22     azithromycin (ZITHROMAX Z-PAK) 250 MG tablet Take 2 capsules on day 1, then 1 capsule daily on day 2-5. 02/14/23   Gelene Mink, NP  Biotin 5000 MCG CAPS Take by mouth.    [provider]  estrogens, conjugated, (PREMARIN) 0.625 MG tablet Take 1 tablet (0.625 mg total) by mouth daily. 04/28/22     estrogens, conjugated, (PREMARIN) 0.625 MG tablet Take 1 tablet (0.625 mg total) by mouth daily. 04/18/23     furosemide (LASIX) 40 MG tablet Take 40 mg by mouth.    [provider]  furosemide (LASIX) 40 MG tablet Take 1 tablet (40 mg total) by mouth daily. 08/25/22     hydrochlorothiazide (HYDRODIURIL) 25 MG tablet Take 1 tablet (25 mg total) by mouth daily. 08/31/22     hydrocortisone (PROCTOZONE-HC) 2.5 % rectal cream Apply 1 application topically 2 (two) times daily. 05/09/22     linaclotide (LINZESS) 145 MCG CAPS capsule Take 1 capsule (145 mcg total) by mouth daily. 05/22/22  linaclotide (LINZESS) 290 MCG CAPS capsule Take 1 capsule (290 mcg total) by mouth daily. 04/23/23     losartan (COZAAR) 100 MG tablet Take 1/2 tablet (50 mg total) by mouth 2 (two) times daily. 05/26/22   Lianne Moris, PA-C  losartan (COZAAR) 50 MG tablet Take 1 tablet (50 mg total) by mouth daily. 04/23/23     ondansetron (ZOFRAN ODT) 4 MG disintegrating tablet Take 1 tablet (4 mg total) by mouth every 8 (eight) hours as needed for nausea or vomiting. Patient not taking: Reported on 05/22/2019 02/14/16   Jacalyn Lefevre, MD  ondansetron (ZOFRAN ODT) 8 MG disintegrating tablet Take 1 tablet (8 mg total) by mouth every 8 (eight) hours as needed. Patient not taking: Reported on 05/22/2019 03/17/18   Zadie Rhine, MD  phentermine (ADIPEX-P) 37.5 MG tablet Take 1 tablet (37.5 mg total) by mouth daily. 05/11/23     potassium chloride SA (KLOR-CON M20) 20 MEQ tablet Take 1 tablet (20 mEq total) by mouth 2 (two) times daily. 10/25/22     POTASSIUM GLUCONATE PO Take by  mouth.    [provider]  PREMARIN 0.625 MG tablet TAKE ONE TABLET BY MOUTH ONCE DAILY 02/03/16   [provider]  Probiotic Product (PROBIOTIC FORMULA PO) Take 1 capsule by mouth daily.    [provider]  triamcinolone (KENALOG) 0.025 % ointment Apply 1 application topically 3 (three) times daily as needed. 09/06/20   Bing Neighbors, NP    Family History Family History  Problem Relation Age of Onset   Hypertension Mother    Hypertension Sister    Hypertension Brother    Colon polyps Father    Colon cancer Neg Hx    Esophageal cancer Neg Hx    Rectal cancer Neg Hx    Stomach cancer Neg Hx     Social History Social History   Tobacco Use   Smoking status: Never   Smokeless tobacco: Never  Vaping Use   Vaping status: Never Used  Substance Use Topics   Alcohol use: Never   Drug use: Never     Allergies   Penicillins and Penicillins   Review of Systems Review of Systems Per HPI  Physical Exam Triage Vital Signs ED Triage Vitals  Encounter Vitals Group     BP 05/19/23 1411 119/80     Systolic BP Percentile --      Diastolic BP Percentile --      Pulse Rate 05/19/23 1411 95     Resp 05/19/23 1411 20     Temp 05/19/23 1411 99.3 F (37.4 C)     Temp Source 05/19/23 1411 Oral     SpO2 05/19/23 1411 94 %     Weight --      Height --      Head Circumference --      Peak Flow --      Pain Score 05/19/23 1412 6     Pain Loc --      Pain Education --      Exclude from Growth Chart --    No data found.  Updated Vital Signs BP 119/80 (BP Location: Right Arm)   Pulse 95   Temp 99.3 F (37.4 C) (Oral)   Resp 20   SpO2 94%   Visual Acuity Right Eye Distance:   Left Eye Distance:   Bilateral Distance:    Right Eye Near:   Left Eye Near:    Bilateral Near:     Physical Exam  Vitals and nursing note reviewed.  Constitutional:      General: She is not in acute distress.    Appearance: Normal appearance.  HENT:     Head:  Normocephalic.     Right Ear: Tympanic membrane, ear canal and external ear normal.     Left Ear: Tympanic membrane, ear canal and external ear normal.     Nose: Nose normal.     Mouth/Throat:     Mouth: Mucous membranes are dry.     Pharynx: No posterior oropharyngeal erythema.  Eyes:     Extraocular Movements: Extraocular movements intact.     Conjunctiva/sclera: Conjunctivae normal.     Pupils: Pupils are equal, round, and reactive to light.  Cardiovascular:     Rate and Rhythm: Normal rate and regular rhythm.     Pulses: Normal pulses.     Heart sounds: Normal heart sounds.  Pulmonary:     Effort: Pulmonary effort is normal. No respiratory distress.     Breath sounds: Normal breath sounds. No stridor. No wheezing, rhonchi or rales.  Abdominal:     General: Bowel sounds are normal.     Palpations: Abdomen is soft.     Tenderness: There is no abdominal tenderness.  Musculoskeletal:     Cervical back: Normal range of motion.  Lymphadenopathy:     Cervical: No cervical adenopathy.  Skin:    General: Skin is warm and dry.  Neurological:     General: No focal deficit present.     Mental Status: She is alert and oriented to person, place, and time.  Psychiatric:        Mood and Affect: Mood normal.        Behavior: Behavior normal.      UC Treatments / Results  Labs (all labs ordered are listed, but only abnormal results are displayed) Labs Reviewed  POCT INFLUENZA A/B - Normal    EKG   Radiology No results found.  Procedures Procedures (including critical care time)  Medications Ordered in UC Medications - No data to display  Initial Impression / Assessment and Plan / UC Course  I have reviewed the triage vital signs and the nursing notes.  Pertinent labs & imaging results that were available during my care of the patient were reviewed by me and considered in my medical decision making (see chart for details).  Influenza test was negative.  Zofran 4 mg ODT  given in office for patient's nausea.  Symptoms are consistent with a viral illness.  Will treat patient with ondansetron 4 mg ODT for nausea.  For her cough, Promethazine DM was prescribed.  Supportive care recommendations were provided and discussed with the patient to include fluids, rest, and over-the-counter analgesics.  Emphasized the importance of drinking fluids.  Patient was given indications regarding follow-up.  Patient was in agreement with this plan of care and verbalizes understanding.  All questions were answered.  Patient stable for discharge.  Work note was provided.  Final Clinical Impressions(s) / UC Diagnoses   Final diagnoses:  None   Discharge Instructions   None    ED Prescriptions   None    PDMP not reviewed this encounter.   Abran Cantor, NP 05/19/23 1520

## 2023-05-22 ENCOUNTER — Ambulatory Visit (HOSPITAL_COMMUNITY)

## 2023-05-31 ENCOUNTER — Encounter (HOSPITAL_COMMUNITY): Payer: Self-pay

## 2023-05-31 ENCOUNTER — Ambulatory Visit (HOSPITAL_COMMUNITY)

## 2023-06-08 ENCOUNTER — Ambulatory Visit (HOSPITAL_COMMUNITY)
Admission: RE | Admit: 2023-06-08 | Discharge: 2023-06-08 | Disposition: A | Discharge status: Home / Self Care | Source: Ambulatory Visit | Attending: Family Medicine | Admitting: Family Medicine

## 2023-06-08 ENCOUNTER — Other Ambulatory Visit (HOSPITAL_COMMUNITY): Payer: Self-pay | Admitting: Family Medicine

## 2023-06-08 DIAGNOSIS — Z2089 Contact with and (suspected) exposure to other communicable diseases: Secondary | ICD-10-CM | POA: Diagnosis not present

## 2023-06-08 DIAGNOSIS — J9811 Atelectasis: Secondary | ICD-10-CM | POA: Diagnosis not present

## 2023-06-08 DIAGNOSIS — R079 Chest pain, unspecified: Secondary | ICD-10-CM | POA: Diagnosis not present

## 2023-07-24 ENCOUNTER — Other Ambulatory Visit: Payer: Self-pay

## 2023-07-24 ENCOUNTER — Other Ambulatory Visit (HOSPITAL_COMMUNITY): Payer: Self-pay

## 2023-08-02 ENCOUNTER — Ambulatory Visit (HOSPITAL_COMMUNITY)

## 2023-08-10 ENCOUNTER — Ambulatory Visit (HOSPITAL_COMMUNITY)
Admission: RE | Admit: 2023-08-10 | Discharge: 2023-08-10 | Disposition: A | Discharge status: Home / Self Care | Source: Ambulatory Visit | Attending: Family Medicine | Admitting: Family Medicine

## 2023-08-10 DIAGNOSIS — Z9049 Acquired absence of other specified parts of digestive tract: Secondary | ICD-10-CM | POA: Diagnosis not present

## 2023-08-10 DIAGNOSIS — R748 Abnormal levels of other serum enzymes: Secondary | ICD-10-CM | POA: Diagnosis not present

## 2023-08-25 ENCOUNTER — Other Ambulatory Visit (HOSPITAL_COMMUNITY): Payer: Self-pay

## 2023-08-27 ENCOUNTER — Other Ambulatory Visit (HOSPITAL_COMMUNITY): Payer: Self-pay

## 2023-08-27 ENCOUNTER — Other Ambulatory Visit: Payer: Self-pay

## 2023-08-27 MED ORDER — LINACLOTIDE 290 MCG PO CAPS
290.0000 ug | ORAL_CAPSULE | Freq: Every day | ORAL | 3 refills | Status: AC
  Filled 2023-08-27: qty 90, 90d supply, fill #0
  Filled 2023-12-13: qty 90, 90d supply, fill #1

## 2023-08-27 MED ORDER — ATORVASTATIN CALCIUM 20 MG PO TABS
20.0000 mg | ORAL_TABLET | Freq: Every day | ORAL | 3 refills | Status: AC
  Filled 2023-08-27: qty 90, 90d supply, fill #0
  Filled 2023-12-13 (×2): qty 90, 90d supply, fill #1

## 2023-08-27 MED ORDER — HYDROCORTISONE (PERIANAL) 2.5 % EX CREA
1.0000 | TOPICAL_CREAM | Freq: Two times a day (BID) | CUTANEOUS | 6 refills | Status: AC
  Filled 2023-08-27: qty 30, 10d supply, fill #0
  Filled 2024-01-08: qty 30, 10d supply, fill #1

## 2023-10-16 ENCOUNTER — Other Ambulatory Visit (HOSPITAL_COMMUNITY): Payer: Self-pay

## 2023-10-16 ENCOUNTER — Encounter: Payer: Self-pay | Admitting: Pharmacist

## 2023-10-16 ENCOUNTER — Other Ambulatory Visit (HOSPITAL_BASED_OUTPATIENT_CLINIC_OR_DEPARTMENT_OTHER): Payer: Self-pay

## 2023-10-16 ENCOUNTER — Other Ambulatory Visit: Payer: Self-pay

## 2023-10-17 ENCOUNTER — Other Ambulatory Visit (HOSPITAL_COMMUNITY): Payer: Self-pay

## 2023-10-17 ENCOUNTER — Other Ambulatory Visit: Payer: Self-pay

## 2023-10-17 MED ORDER — ALPRAZOLAM 0.5 MG PO TABS
0.5000 mg | ORAL_TABLET | Freq: Every day | ORAL | 0 refills | Status: DC
  Filled 2023-10-17 – 2023-10-19 (×3): qty 90, 90d supply, fill #0

## 2023-10-19 ENCOUNTER — Other Ambulatory Visit (HOSPITAL_COMMUNITY): Payer: Self-pay

## 2023-10-30 ENCOUNTER — Other Ambulatory Visit: Payer: Self-pay

## 2023-10-30 NOTE — Progress Notes (Addendum)
 Gastroenterology Office Note    Referring Provider: [facility] Primary Care Physician:  Dow Longs, PA-C  Primary GI: Dr. Cindie   Chief Complaint   Chief Complaint  Patient presents with   elevated alk phos     History of Present Illness   Latoya Williams is a 53 y.o. female presenting today at the request of Dow Longs, PA-C due to chronic isolated elevation of alk phos.   US  abdomen complete: fatty liver s/p chole, no CBD dilation, spleen not visualized.   Cholecystectomy in the 1990s.   Hx of profound anemia in the past, s/p hysterectomy. Had issues from 1994-2007. Resolved.   Constipation: Linzess  290 mcg, BM daily. No rectal bleeding. No straining. Takes in mornings but hits when getting home.    Getting ready to start wegovy .   Regarding alk phos: trending up recently, labs in media.  She has felt very fatigued recently.  She has to take naps on the day but normally feels very active.  Denies any snoring at night.  She has not been tested for sleep apnea.  She denies any overt GI bleeding or pruritus.  notes nausea intermittently off and on, states started about a month ago when alk phos was trending up.  No esophageal dysphagia.  She does occasionally have a globus sensation.  Nausea is associated with stress.  She denies any shortness of breath, chest pain, denies depression, no abdominal pain.  She has started to have lower extremity edema about 2 months ago and then the nausea followed and fatigue.    Colonoscopy March 2021 Dr. Shila: non-bleeding internal hemorrhoids, melanosis in colon, four 1-2 mm polyps in rectum and ascending colon. Tubular adenoma and hyperplastic. 7 year surveillance.     Past Medical History:  Diagnosis Date   Constipation    on Linzess  with soft regular bowel movements    High cholesterol    Hypercholesterolemia    Hypertension     Past Surgical History:  Procedure Laterality Date   ABDOMINAL  HYSTERECTOMY     ABDOMINAL SURGERY     BONE MARROW BIOPSY     CHOLECYSTECTOMY     LAPAROSCOPIC OOPHERECTOMY     OOPHORECTOMY      Current Outpatient Medications  Medication Sig Dispense Refill   ALPRAZolam  (XANAX ) 0.5 MG tablet Take 1 tablet (0.5 mg total) by mouth daily. 90 tablet 0   amLODipine  (NORVASC ) 10 MG tablet Take 10 mg by mouth daily.     atorvastatin  (LIPITOR) 20 MG tablet Take 1 tablet (20 mg total) by mouth daily. 90 tablet 3   estrogens , conjugated, (PREMARIN ) 0.625 MG tablet Take 1 tablet (0.625 mg total) by mouth daily. 90 tablet 1   furosemide  (LASIX ) 40 MG tablet Take 1 tablet (40 mg total) by mouth daily. 90 tablet 3   hydrochlorothiazide  (HYDRODIURIL ) 25 MG tablet Take 1 tablet (25 mg total) by mouth daily. 90 tablet 3   hydrocortisone  (PROCTOZONE -HC) 2.5 % rectal cream Apply 1 application topically 2 (two) times daily. 30 g 6   linaclotide  (LINZESS ) 290 MCG CAPS capsule Take 1 capsule (290 mcg total) by mouth daily. 90 capsule 3   phentermine  (ADIPEX-P ) 37.5 MG tablet Take 1 tablet (37.5 mg total) by mouth daily. (Patient taking differently: Take 37.5 mg by mouth daily. Takes a half a pill) 30 tablet 2   potassium chloride  SA (KLOR-CON  M20) 20 MEQ tablet Take 1 tablet (20 mEq total) by mouth 2 (two) times  daily. 180 tablet 5   PREMARIN  0.625 MG tablet TAKE ONE TABLET BY MOUTH ONCE DAILY  3   Probiotic Product (PROBIOTIC FORMULA PO) Take 1 capsule by mouth daily.     No current facility-administered medications for this visit.    Allergies as of 10/31/2023 - Review Complete 10/31/2023  Allergen Reaction Noted   Penicillins Hives 02/14/2016   Penicillins  03/17/2018    Family History  Problem Relation Age of Onset   Hypertension Mother    Colon polyps Father    Hypertension Sister    Hypertension Brother    Colon cancer Neg Hx    Esophageal cancer Neg Hx    Rectal cancer Neg Hx    Stomach cancer Neg Hx    Liver disease Neg Hx     Social History    Socioeconomic History   Marital status: Single    Spouse name: Not on file   Number of children: Not on file   Years of education: Not on file   Highest education level: Not on file  Occupational History   Not on file  Tobacco Use   Smoking status: Never   Smokeless tobacco: Never  Vaping Use   Vaping status: Never Used  Substance and Sexual Activity   Alcohol  use: Never   Drug use: Never   Sexual activity: Yes    Birth control/protection: Surgical  Other Topics Concern   Not on file  Social History Narrative   ** Merged History Encounter **       Social Drivers of Corporate Investment Banker Strain: Not on file  Food Insecurity: Not on file  Transportation Needs: Not on file  Physical Activity: Not on file  Stress: Not on file  Social Connections: Not on file  Intimate Partner Violence: Not on file     Review of Systems   Gen: Denies any fever, chills, fatigue, weight loss, lack of appetite.  CV: Denies chest pain, heart palpitations, peripheral edema, syncope.  Resp: Denies shortness of breath at rest or with exertion. Denies wheezing or cough.  GI: Denies dysphagia or odynophagia. Denies jaundice, hematemesis, fecal incontinence. GU : Denies urinary burning, urinary frequency, urinary hesitancy MS: Denies joint pain, muscle weakness, cramps, or limitation of movement.  Derm: Denies rash, itching, dry skin Psych: Denies depression, anxiety, memory loss, and confusion Heme: Denies bruising, bleeding, and enlarged lymph nodes.   Physical Exam   BP 125/78 (BP Location: Right Arm, Patient Position: Sitting, Cuff Size: Normal)   Pulse 81   Temp 97.9 F (36.6 C) (Oral)   Ht 5' 1.5 (1.562 m)   Wt 179 lb 3.2 oz (81.3 kg)   BMI 33.31 kg/m  General:   Alert and oriented. Pleasant and cooperative. Well-nourished and well-developed.  Head:  Normocephalic and atraumatic. Eyes:  Without icterus Ears:  Normal auditory acuity. Lungs:  Clear to auscultation  bilaterally.  Heart:  S1, S2 present with systolic murmur Abdomen:  +BS, soft, non-tender and non-distended. No HSM noted. No guarding or rebound. No masses appreciated.  Rectal:  Deferred  Msk:  Symmetrical without gross deformities. Normal posture. Extremities:  Without edema. Neurologic:  Alert and  oriented x4;  grossly normal neurologically. Skin:  Intact without significant lesions or rashes. Psych:  Alert and cooperative. Normal mood and affect.   Assessment/Plan   Latoya Williams is a 53 y.o. female presenting today with history of chronic isolated elevation of alk phos, increasing recently, hx of anemia in the past,  hepatic steatosis, constipation, for further evaluation of alk phos.  US  abdomen without CBD dilation. Spleen not visualized. Will check serologies to determine source. Discussed may need MRI/MRCP as part of evaluation. Gallbladder absent.  Constellation of symptoms including fatigue, nausea, lower extremity edema. Unclear etiology. I do note systolic murmur on exam. Could have elevated alk phos if congestive hepatopathy. Will request ECHO as well. I have also ordered additional labs (celiac, anemia panel) along with alk phos evaluation.   Constipation managed well with Linzess  290 mcg daily.   Colonoscopy 2028: desires to stay with Dr. Nandigam for this.      Latoya MICAEL Stager, PhD, ANP-BC [facility]   ADDENDUM 11/25: Work-up this far with normal GGT, isoenzymes with liver predominantly, bone fractionated 26%,  AMA negative, IgA slightly elevated, B12 deficiency and IDA deficiency.   Notes continued nausea, intermittent hiccups for several months, declining colonoscopy but would like EGD. This is appropriate in light of IDA, B12 deficiency, dyspepsia.   Nausea has been present prior to starting Wegovy . May need PPI but will await EGD findings. Recommending MRI/MRCP to conclude alk phos work-up., Also recommend referral to Hematology with  elevated IgA when ready.  Proceed with upper endoscopy by Dr. Cindie in near future: the risks, benefits, and alternatives have been discussed with the patient in detail. The patient states understanding and desires to proceed.

## 2023-10-31 ENCOUNTER — Encounter: Payer: Self-pay | Admitting: Gastroenterology

## 2023-10-31 ENCOUNTER — Ambulatory Visit: Admitting: Gastroenterology

## 2023-10-31 VITALS — BP 125/78 | HR 81 | Temp 97.9°F | Ht 61.5 in | Wt 179.2 lb

## 2023-10-31 DIAGNOSIS — Z860102 Personal history of hyperplastic colon polyps: Secondary | ICD-10-CM

## 2023-10-31 DIAGNOSIS — Z860101 Personal history of adenomatous and serrated colon polyps: Secondary | ICD-10-CM | POA: Diagnosis not present

## 2023-10-31 DIAGNOSIS — R5383 Other fatigue: Secondary | ICD-10-CM

## 2023-10-31 DIAGNOSIS — R748 Abnormal levels of other serum enzymes: Secondary | ICD-10-CM

## 2023-10-31 DIAGNOSIS — R011 Cardiac murmur, unspecified: Secondary | ICD-10-CM

## 2023-10-31 DIAGNOSIS — R110 Nausea: Secondary | ICD-10-CM | POA: Diagnosis not present

## 2023-10-31 DIAGNOSIS — R600 Localized edema: Secondary | ICD-10-CM

## 2023-10-31 NOTE — Patient Instructions (Signed)
 Please have labs done at [facility], and I will be in touch as soon as it results!  I have ordered an echocardiogram of your heart.   We will have a plan to follow!  It was a pleasure to see you today. I want to create trusting relationships with patients and provide genuine, compassionate, and quality care. I truly value your feedback, so please be on the lookout for a survey regarding your visit with me today. I appreciate your time in completing this!         Therisa MICAEL Stager, PhD, ANP-BC [facility] Gastroenterology

## 2023-11-02 DIAGNOSIS — R748 Abnormal levels of other serum enzymes: Secondary | ICD-10-CM | POA: Diagnosis not present

## 2023-11-08 LAB — ALKALINE PHOSPHATASE, ISOENZYMES
Alkaline Phosphatase: 204 IU/L — ABNORMAL HIGH (ref 44–121)
BONE FRACTION: 26 (ref 14–68)
INTESTINAL FRAC.: 0 (ref 0–18)
LIVER FRACTION: 74 (ref 18–85)

## 2023-11-08 LAB — NUCLEOTIDASE, 5', BLOOD: 5-Nucleotidase: 4 IU/L (ref 0–11)

## 2023-11-08 LAB — FOLATE: Folate: 3.3 ng/mL (ref >3.0)

## 2023-11-08 LAB — IRON AND TIBC
Iron Saturation: 20 % (ref 15–55)
Iron: 71 ug/dL (ref 27–159)
Total Iron Binding Capacity: 353 ug/dL (ref 250–450)
UIBC: 282 ug/dL (ref 131–425)

## 2023-11-08 LAB — VITAMIN B12: Vitamin B-12: 206 pg/mL — ABNORMAL LOW (ref 232–1245)

## 2023-11-08 LAB — CELIAC DISEASE PANEL
IgA/Immunoglobulin A, Serum: 487 mg/dL — ABNORMAL HIGH (ref 87–352)
Transglutaminase IgA: <2 U/mL (ref 0–3)

## 2023-11-08 LAB — FERRITIN: Ferritin: 27 ng/mL (ref 15–150)

## 2023-11-08 LAB — GAMMA GT: GGT: 12 IU/L (ref 0–60)

## 2023-11-09 ENCOUNTER — Other Ambulatory Visit (HOSPITAL_BASED_OUTPATIENT_CLINIC_OR_DEPARTMENT_OTHER): Payer: Self-pay

## 2023-11-09 MED ORDER — AMLODIPINE BESYLATE 10 MG PO TABS
10.0000 mg | ORAL_TABLET | Freq: Every day | ORAL | 3 refills | Status: DC
  Filled 2023-11-09: qty 90, 90d supply, fill #0

## 2023-11-13 ENCOUNTER — Ambulatory Visit: Payer: Self-pay | Admitting: Gastroenterology

## 2023-11-13 DIAGNOSIS — D531 Other megaloblastic anemias, not elsewhere classified: Secondary | ICD-10-CM

## 2023-11-13 DIAGNOSIS — D649 Anemia, unspecified: Secondary | ICD-10-CM

## 2023-11-13 DIAGNOSIS — R748 Abnormal levels of other serum enzymes: Secondary | ICD-10-CM

## 2023-11-16 ENCOUNTER — Other Ambulatory Visit (HOSPITAL_BASED_OUTPATIENT_CLINIC_OR_DEPARTMENT_OTHER): Payer: Self-pay

## 2023-11-16 ENCOUNTER — Ambulatory Visit (HOSPITAL_COMMUNITY)
Admission: RE | Admit: 2023-11-16 | Discharge: 2023-11-16 | Disposition: A | Discharge status: Home / Self Care | Source: Ambulatory Visit | Attending: Gastroenterology | Admitting: Gastroenterology

## 2023-11-16 DIAGNOSIS — R011 Cardiac murmur, unspecified: Secondary | ICD-10-CM | POA: Insufficient documentation

## 2023-11-16 LAB — ECHOCARDIOGRAM COMPLETE
AR max vel: 2.02 cm2
AV Peak grad: 6.1 mmHg
Ao pk vel: 1.23 m/s
Area-P 1/2: 3.03 cm2
Calc EF: 60.6 %
S' Lateral: 2.9 cm
Single Plane A2C EF: 60 %
Single Plane A4C EF: 62.5 %

## 2023-11-16 MED ORDER — CYANOCOBALAMIN 1000 MCG/ML IJ SOLN
INTRAMUSCULAR | 0 refills | Status: AC
  Filled 2023-11-16: qty 4, 30d supply, fill #0
  Filled 2023-12-13 (×2): qty 3, 90d supply, fill #1
  Filled 2023-12-13: qty 1, 30d supply, fill #1
  Filled 2024-02-20 – 2024-02-26 (×2): qty 3, 90d supply, fill #2

## 2023-11-16 MED ORDER — FOLIC ACID 1 MG PO TABS
2.0000 mg | ORAL_TABLET | Freq: Every day | ORAL | 5 refills | Status: AC
  Filled 2023-11-16: qty 60, 30d supply, fill #0
  Filled 2023-12-13: qty 60, 30d supply, fill #1
  Filled 2024-01-15: qty 60, 30d supply, fill #2
  Filled 2024-02-20: qty 60, 30d supply, fill #3
  Filled 2024-04-11: qty 60, 30d supply, fill #4

## 2023-11-20 LAB — MITOCHONDRIAL ANTIBODIES: Mitochondrial Ab: <20 U (ref 0.0–20.0)

## 2023-11-20 LAB — CELIAC DISEASE PANEL
IgA/Immunoglobulin A, Serum: 472 mg/dL — ABNORMAL HIGH (ref 87–352)
Transglutaminase IgA: <2 U/mL (ref 0–3)

## 2023-11-21 ENCOUNTER — Other Ambulatory Visit: Payer: Self-pay | Admitting: *Deleted

## 2023-11-21 DIAGNOSIS — I10 Essential (primary) hypertension: Secondary | ICD-10-CM

## 2023-11-21 DIAGNOSIS — R600 Localized edema: Secondary | ICD-10-CM

## 2023-11-21 NOTE — Telephone Encounter (Signed)
Referral placed for cardiology

## 2023-11-29 ENCOUNTER — Other Ambulatory Visit (HOSPITAL_BASED_OUTPATIENT_CLINIC_OR_DEPARTMENT_OTHER): Payer: Self-pay

## 2023-11-29 ENCOUNTER — Other Ambulatory Visit: Payer: Self-pay | Admitting: Gastroenterology

## 2023-11-29 DIAGNOSIS — D531 Other megaloblastic anemias, not elsewhere classified: Secondary | ICD-10-CM

## 2023-11-29 DIAGNOSIS — D649 Anemia, unspecified: Secondary | ICD-10-CM

## 2023-11-29 DIAGNOSIS — R748 Abnormal levels of other serum enzymes: Secondary | ICD-10-CM

## 2023-11-29 MED ORDER — WEGOVY 0.5 MG/0.5ML ~~LOC~~ SOAJ
0.5000 mg | SUBCUTANEOUS | 0 refills | Status: DC
  Filled 2023-11-29: qty 2, 28d supply, fill #0

## 2023-11-29 NOTE — Addendum Note (Signed)
 Addended by: SHIRLEAN THERISA ORN on: 11/29/2023 12:25 PM   Modules accepted: Orders

## 2023-11-30 ENCOUNTER — Other Ambulatory Visit (HOSPITAL_BASED_OUTPATIENT_CLINIC_OR_DEPARTMENT_OTHER): Payer: Self-pay

## 2023-12-12 NOTE — Progress Notes (Unsigned)
 Cardiology Office Note    Date:  12/12/2023  ID:  Latoya Williams, DOB 1970/04/12, MRN [id number] Cardiologist: None { :  History of Present Illness:    Latoya Williams is a 53 y.o. female with past medical history of HTN and HLD who presents to the office today for evaluation of worsening lower extremity edema.  She was last examined by Dr. Hilarie in 05/2021 and reported having occasional episodes of chest pain which would occur at rest or with activity and radiated into her back at times. A stress test was recommended for further assessment and this showed normal perfusion with no evidence of ischemia and was a low risk study.  In the interim, she was evaluated by GI in 10/2023 and reported worsening lower extremity edema for the past 2 months. A murmur was noted on examination and echocardiogram was recommended for further assessment.  Her echocardiogram showed a preserved EF of 60 to 65% with no regional wall motion abnormalities. She did have grade 1 diastolic dysfunction, normal RV function, normal PASP and trivial mitral valve regurgitation. No significant valve abnormalities.  In talking with the patient today, she reports having intermittent lower extremity edema over the past year but symptoms acutely worsened 3 to 4 months ago.  Says that her PCP stopped losartan  due to her being on it for too long.  Switched her to amlodipine  and was initially on 5 mg daily but blood pressure was elevated, therefore this was titrated to 10 mg daily.  She has since been started on Lasix  due to lower extremity swelling and HCTZ was discontinued.  Says her blood pressure is overall well-controlled but she feels very fatigued and tired and feels like this is due to taking Lasix  on a daily basis.  No specific orthopnea or PND.  No recent chest pain or palpitations.  Studies Reviewed:   EKG: EKG is*** ordered today and demonstrates ***   EKG Interpretation Date/Time:    Ventricular Rate:     PR Interval:    QRS Duration:    QT Interval:    QTC Calculation:   R Axis:      Text Interpretation:          NST: 06/2021   Exercise stress test is clnically and electrically negative for ischemia   Myoview  scan shows normal perfusion   LVEF is 73%   Low risk study   Echocardiogram: 11/2023 IMPRESSIONS     1. Left ventricular ejection fraction, by estimation, is 60 to 65%. Left  ventricular ejection fraction by 3D volume is 64 %. The left ventricle has  normal function. The left ventricle has no regional wall motion  abnormalities. Left ventricular diastolic   parameters are consistent with Grade I diastolic dysfunction (impaired  relaxation). The average left ventricular global longitudinal strain is  -20.7 %. The global longitudinal strain is normal.   2. Right ventricular systolic function is normal. The right ventricular  size is normal. There is normal pulmonary artery systolic pressure.   3. The mitral valve is normal in structure. Trivial mitral valve  regurgitation. No evidence of mitral stenosis.   4. The aortic valve is tricuspid. Aortic valve regurgitation is not  visualized. No aortic stenosis is present.   5. The inferior vena cava is normal in size with greater than 50%  respiratory variability, suggesting right atrial pressure of 3 mmHg.   Comparison(s): No prior Echocardiogram.   Risk Assessment/Calculations:   {Does this patient have ATRIAL FIBRILLATION?:[phone]} No  BP recorded.  {Refresh Note OR Click here to enter BP  :1}***         Physical Exam:   VS:  There were no vitals taken for this visit.   Wt Readings from Last 3 Encounters:  10/31/23 179 lb 3.2 oz (81.3 kg)  06/03/21 187 lb 6.4 oz (85 kg)  05/10/21 176 lb (79.8 kg)     GEN: Well nourished, well developed in no acute distress NECK: No JVD; No carotid bruits CARDIAC: ***RRR, no murmurs, rubs, gallops RESPIRATORY:  Clear to auscultation without rales, wheezing or rhonchi   ABDOMEN: Appears non-distended. No obvious abdominal masses. EXTREMITIES: No clubbing or cyanosis. No edema.  Distal pedal pulses are 2+ bilaterally.   Assessment and Plan:      {Are you ordering a CV Procedure (e.g. stress test, cath, DCCV, TEE, etc)?   Press F2        :[id number]}   Signed, Latoya CHRISTELLA Qua, PA-C

## 2023-12-13 ENCOUNTER — Ambulatory Visit: Attending: Student | Admitting: Student

## 2023-12-13 ENCOUNTER — Other Ambulatory Visit (HOSPITAL_BASED_OUTPATIENT_CLINIC_OR_DEPARTMENT_OTHER): Payer: Self-pay

## 2023-12-13 ENCOUNTER — Encounter: Payer: Self-pay | Admitting: Student

## 2023-12-13 VITALS — BP 122/78 | HR 89 | Ht 61.5 in | Wt 174.8 lb

## 2023-12-13 DIAGNOSIS — Z79899 Other long term (current) drug therapy: Secondary | ICD-10-CM | POA: Diagnosis not present

## 2023-12-13 DIAGNOSIS — R609 Edema, unspecified: Secondary | ICD-10-CM

## 2023-12-13 DIAGNOSIS — I10 Essential (primary) hypertension: Secondary | ICD-10-CM | POA: Diagnosis not present

## 2023-12-13 DIAGNOSIS — R600 Localized edema: Secondary | ICD-10-CM | POA: Diagnosis not present

## 2023-12-13 DIAGNOSIS — E782 Mixed hyperlipidemia: Secondary | ICD-10-CM

## 2023-12-13 MED ORDER — POTASSIUM CHLORIDE CRYS ER 20 MEQ PO TBCR
20.0000 meq | EXTENDED_RELEASE_TABLET | Freq: Two times a day (BID) | ORAL | 5 refills | Status: DC
  Filled 2023-12-13: qty 180, 90d supply, fill #0

## 2023-12-13 MED ORDER — ESTROGENS CONJUGATED 0.625 MG PO TABS
0.6250 mg | ORAL_TABLET | Freq: Every day | ORAL | 1 refills | Status: AC
  Filled 2023-12-13: qty 90, 90d supply, fill #0

## 2023-12-13 MED ORDER — LOSARTAN POTASSIUM 50 MG PO TABS
50.0000 mg | ORAL_TABLET | Freq: Two times a day (BID) | ORAL | 3 refills | Status: DC
  Filled 2023-12-13: qty 180, 90d supply, fill #0

## 2023-12-13 NOTE — Patient Instructions (Signed)
 Medication Instructions:   Stop taking Norvasc   Start Taking Losartan  50 mg Two Times Daily   *If you need a refill on your cardiac medications before your next appointment, please call your pharmacy*  Lab Work: Your physician recommends that you return for lab work in: 2 Weeks ( BMET)   If you have labs (blood work) drawn today and your tests are completely normal, you will receive your results only by: [employer] (if you have MyChart) OR A paper copy in the mail If you have any lab test that is abnormal or we need to change your treatment, we will call you to review the results.  Testing/Procedures: NONE    Follow-Up: At [facility], you and your health needs are our priority.  As part of our continuing mission to provide you with exceptional heart care, our providers are all part of one team.  This team includes your primary Cardiologist (physician) and Advanced Practice Providers or APPs (Physician Assistants and Nurse Practitioners) who all work together to provide you with the care you need, when you need it.  Your next appointment:    December   Provider:   Laymon Qua, PA-C    We recommend signing up for the patient portal called MyChart.  Sign up information is provided on this After Visit Summary.  MyChart is used to connect with patients for Virtual Visits (Telemedicine).  Patients are able to view lab/test results, encounter notes, upcoming appointments, etc.  Non-urgent messages can be sent to your provider as well.   To learn more about what you can do with MyChart, go to [email or web address].   Other Instructions Thank you for choosing [facility]!

## 2023-12-14 ENCOUNTER — Encounter: Payer: Self-pay | Admitting: Student

## 2023-12-14 ENCOUNTER — Other Ambulatory Visit (HOSPITAL_BASED_OUTPATIENT_CLINIC_OR_DEPARTMENT_OTHER): Payer: Self-pay

## 2023-12-27 ENCOUNTER — Other Ambulatory Visit: Payer: Self-pay | Admitting: Student

## 2023-12-27 ENCOUNTER — Other Ambulatory Visit (HOSPITAL_BASED_OUTPATIENT_CLINIC_OR_DEPARTMENT_OTHER): Payer: Self-pay

## 2023-12-27 ENCOUNTER — Encounter: Payer: Self-pay | Admitting: Student

## 2023-12-27 ENCOUNTER — Ambulatory Visit: Admitting: Student

## 2023-12-27 ENCOUNTER — Telehealth: Payer: Self-pay

## 2023-12-27 DIAGNOSIS — Z79899 Other long term (current) drug therapy: Secondary | ICD-10-CM

## 2023-12-27 MED ORDER — CHLORTHALIDONE 25 MG PO TABS: 12.5000 mg | ORAL_TABLET | Freq: Every day | ORAL | 2 refills | Status: DC

## 2023-12-27 MED ORDER — CHLORTHALIDONE 25 MG PO TABS
12.5000 mg | ORAL_TABLET | Freq: Every day | ORAL | 2 refills | Status: AC
  Filled 2023-12-27 (×2): qty 45, 90d supply, fill #0
  Filled 2024-03-21: qty 45, 90d supply, fill #1

## 2023-12-27 NOTE — Telephone Encounter (Signed)
 Needs [facility] BMET and meds to [facility] (previously sent to [facility] [place]).

## 2023-12-28 ENCOUNTER — Other Ambulatory Visit (HOSPITAL_BASED_OUTPATIENT_CLINIC_OR_DEPARTMENT_OTHER): Payer: Self-pay

## 2023-12-28 MED ORDER — WEGOVY 1 MG/0.5ML ~~LOC~~ SOAJ
1.0000 mg | SUBCUTANEOUS | 0 refills | Status: DC
  Filled 2023-12-28: qty 2, 28d supply, fill #0

## 2024-01-04 ENCOUNTER — Other Ambulatory Visit (HOSPITAL_BASED_OUTPATIENT_CLINIC_OR_DEPARTMENT_OTHER): Payer: Self-pay

## 2024-01-04 ENCOUNTER — Other Ambulatory Visit (HOSPITAL_COMMUNITY)
Admission: RE | Admit: 2024-01-04 | Discharge: 2024-01-04 | Disposition: A | Discharge status: Home / Self Care | Source: Ambulatory Visit | Attending: Student | Admitting: Student

## 2024-01-04 ENCOUNTER — Other Ambulatory Visit (HOSPITAL_COMMUNITY)
Admission: RE | Admit: 2024-01-04 | Discharge: 2024-01-04 | Disposition: A | Discharge status: Home / Self Care | Source: Ambulatory Visit | Attending: Gastroenterology | Admitting: Gastroenterology

## 2024-01-04 ENCOUNTER — Ambulatory Visit: Payer: Self-pay | Admitting: Student

## 2024-01-04 ENCOUNTER — Other Ambulatory Visit: Payer: Self-pay | Admitting: *Deleted

## 2024-01-04 DIAGNOSIS — D531 Other megaloblastic anemias, not elsewhere classified: Secondary | ICD-10-CM | POA: Insufficient documentation

## 2024-01-04 DIAGNOSIS — R600 Localized edema: Secondary | ICD-10-CM

## 2024-01-04 DIAGNOSIS — D649 Anemia, unspecified: Secondary | ICD-10-CM | POA: Diagnosis not present

## 2024-01-04 DIAGNOSIS — R748 Abnormal levels of other serum enzymes: Secondary | ICD-10-CM | POA: Insufficient documentation

## 2024-01-04 DIAGNOSIS — Z79899 Other long term (current) drug therapy: Secondary | ICD-10-CM | POA: Insufficient documentation

## 2024-01-04 LAB — CBC WITH DIFFERENTIAL/PLATELET
Abs Immature Granulocytes: 0.01 K/uL (ref 0.00–0.07)
Basophils Absolute: 0 K/uL (ref 0.0–0.1)
Basophils Relative: 1 %
Eosinophils Absolute: 0.1 K/uL (ref 0.0–0.5)
Eosinophils Relative: 2 %
HCT: 38 % (ref 36.0–46.0)
Hemoglobin: 12.2 g/dL (ref 12.0–15.0)
Immature Granulocytes: 0 %
Lymphocytes Relative: 32 %
Lymphs Abs: 1.9 K/uL (ref 0.7–4.0)
MCH: 26.3 pg (ref 26.0–34.0)
MCHC: 32.1 g/dL (ref 30.0–36.0)
MCV: 81.9 fL (ref 80.0–100.0)
Monocytes Absolute: 0.5 K/uL (ref 0.1–1.0)
Monocytes Relative: 8 %
Neutro Abs: 3.5 K/uL (ref 1.7–7.7)
Neutrophils Relative %: 57 %
Platelets: 418 K/uL — ABNORMAL HIGH (ref 150–400)
RBC: 4.64 MIL/uL (ref 3.87–5.11)
RDW: 14.6 % (ref 11.5–15.5)
WBC: 6.1 K/uL (ref 4.0–10.5)
nRBC: 0 % (ref 0.0–0.2)

## 2024-01-04 LAB — HEPATIC FUNCTION PANEL
ALT: 8 U/L (ref 0–44)
AST: 19 U/L (ref 15–41)
Albumin: 4.4 g/dL (ref 3.5–5.0)
Alkaline Phosphatase: 178 U/L — ABNORMAL HIGH (ref 38–126)
Bilirubin, Direct: 0.1 mg/dL (ref 0.0–0.2)
Indirect Bilirubin: 0.2 mg/dL — ABNORMAL LOW (ref 0.3–0.9)
Total Bilirubin: 0.3 mg/dL (ref 0.0–1.2)
Total Protein: 8.2 g/dL — ABNORMAL HIGH (ref 6.5–8.1)

## 2024-01-04 LAB — FERRITIN: Ferritin: 26 ng/mL (ref 11–307)

## 2024-01-04 LAB — BASIC METABOLIC PANEL WITH GFR
Anion gap: 11 (ref 5–15)
BUN: 8 mg/dL (ref 6–20)
CO2: 26 mmol/L (ref 22–32)
Calcium: 6.5 mg/dL — ABNORMAL LOW (ref 8.9–10.3)
Chloride: 101 mmol/L (ref 98–111)
Creatinine, Ser: 0.82 mg/dL (ref 0.44–1.00)
GFR, Estimated: >60 mL/min (ref >60)
Glucose, Bld: 82 mg/dL (ref 70–99)
Potassium: 4.1 mmol/L (ref 3.5–5.1)
Sodium: 138 mmol/L (ref 135–145)

## 2024-01-04 LAB — IRON AND TIBC
Iron: 46 ug/dL (ref 28–170)
Saturation Ratios: 12 % (ref 10.4–31.8)
TIBC: 393 ug/dL (ref 250–450)
UIBC: 347 ug/dL

## 2024-01-04 LAB — VITAMIN B12: Vitamin B-12: 705 pg/mL (ref 180–914)

## 2024-01-04 LAB — FOLATE: Folate: >20 ng/mL (ref >5.9)

## 2024-01-04 MED ORDER — FLUZONE 0.5 ML IM SUSY
0.5000 mL | PREFILLED_SYRINGE | Freq: Once | INTRAMUSCULAR | 0 refills | Status: AC
  Filled 2024-01-04: qty 0.5, 1d supply, fill #0

## 2024-01-05 LAB — IGA: IgA: 504 mg/dL — ABNORMAL HIGH (ref 87–352)

## 2024-01-07 LAB — HOMOCYSTEINE: Homocysteine: 9.3 umol/L (ref 0.0–14.5)

## 2024-01-08 ENCOUNTER — Other Ambulatory Visit (HOSPITAL_BASED_OUTPATIENT_CLINIC_OR_DEPARTMENT_OTHER): Payer: Self-pay

## 2024-01-09 ENCOUNTER — Other Ambulatory Visit (HOSPITAL_BASED_OUTPATIENT_CLINIC_OR_DEPARTMENT_OTHER): Payer: Self-pay

## 2024-01-09 LAB — METHYLMALONIC ACID, SERUM: Methylmalonic Acid, Quantitative: 80 nmol/L (ref 0–378)

## 2024-01-09 MED ORDER — ALPRAZOLAM 0.5 MG PO TABS
0.5000 mg | ORAL_TABLET | Freq: Every day | ORAL | 1 refills | Status: AC
  Filled 2024-01-09 – 2024-01-15 (×3): qty 90, 90d supply, fill #0
  Filled 2024-04-11: qty 90, 90d supply, fill #1

## 2024-01-10 ENCOUNTER — Other Ambulatory Visit (HOSPITAL_BASED_OUTPATIENT_CLINIC_OR_DEPARTMENT_OTHER): Payer: Self-pay

## 2024-01-15 ENCOUNTER — Other Ambulatory Visit (HOSPITAL_BASED_OUTPATIENT_CLINIC_OR_DEPARTMENT_OTHER): Payer: Self-pay

## 2024-01-15 ENCOUNTER — Ambulatory Visit: Payer: Self-pay | Admitting: Gastroenterology

## 2024-01-17 DIAGNOSIS — H52223 Regular astigmatism, bilateral: Secondary | ICD-10-CM | POA: Diagnosis not present

## 2024-01-17 DIAGNOSIS — H5203 Hypermetropia, bilateral: Secondary | ICD-10-CM | POA: Diagnosis not present

## 2024-01-17 DIAGNOSIS — H524 Presbyopia: Secondary | ICD-10-CM | POA: Diagnosis not present

## 2024-01-22 ENCOUNTER — Other Ambulatory Visit (HOSPITAL_BASED_OUTPATIENT_CLINIC_OR_DEPARTMENT_OTHER): Payer: Self-pay

## 2024-01-22 MED ORDER — ONDANSETRON HCL 4 MG PO TABS
4.0000 mg | ORAL_TABLET | Freq: Three times a day (TID) | ORAL | 0 refills | Status: DC | PRN
  Filled 2024-01-22: qty 15, 5d supply, fill #0

## 2024-01-25 ENCOUNTER — Other Ambulatory Visit (HOSPITAL_BASED_OUTPATIENT_CLINIC_OR_DEPARTMENT_OTHER): Payer: Self-pay

## 2024-01-25 MED ORDER — WEGOVY 1 MG/0.5ML ~~LOC~~ SOAJ
1.0000 mg | SUBCUTANEOUS | 0 refills | Status: DC
  Filled 2024-01-25 – 2024-01-28 (×7): qty 2, 28d supply, fill #0

## 2024-01-28 ENCOUNTER — Other Ambulatory Visit (HOSPITAL_BASED_OUTPATIENT_CLINIC_OR_DEPARTMENT_OTHER): Payer: Self-pay

## 2024-01-28 ENCOUNTER — Telehealth: Payer: Self-pay | Admitting: Gastroenterology

## 2024-01-28 MED ORDER — ONDANSETRON HCL 4 MG PO TABS: 4.0000 mg | ORAL_TABLET | Freq: Three times a day (TID) | ORAL | 1 refills | Status: AC | PRN

## 2024-01-28 NOTE — Telephone Encounter (Signed)
 Intermittent nausea noted. Short term supply from PCP. I have sent in Zofran  for patient.

## 2024-01-29 ENCOUNTER — Telehealth: Payer: Self-pay | Admitting: Gastroenterology

## 2024-01-29 NOTE — Telephone Encounter (Signed)
 Spoke with pt. She has been scheduled for 12/8. Will give pt instructions.

## 2024-01-29 NOTE — Telephone Encounter (Signed)
 Please arrange EGD with Dr. Cindie due to dyspepsia (ongoing nausea, hiccups) AND B12 and IDA deficiency.   Hold Wegovy  one week prior. She typically takes this on Mondays.

## 2024-02-05 NOTE — Progress Notes (Signed)
 Cardiology Office Note    Date:  02/08/2024  ID:  MIKEALA GIRDLER, DOB 05-16-1970, MRN [id number] Cardiologist: Stanly DELENA Leavens, MD Cardiology APP:  Johnson Laymon HERO, PA-C { :  History of Present Illness:    Latoya Williams is a 53 y.o. female with past medical history of HTN and HLD who presents to the office today for 58-month follow-up.  She was examined by myself in 12/2023 and reported worsening lower extremity edema over the past 3 to 4 months and had recently been switched to Amlodipine  by her PCP which had been titrated to 10 mg daily. She was having to take Lasix  daily due to her edema and reported feeling very fatigued with the medication. It was felt that her edema was due to Amlodipine  given her recent reassuring echocardiogram report and Amlodipine  was stopped and she was restarted on Losartan  50 mg twice daily as she previously tolerated well. It was recommended that she gradually reduce Lasix  and only take this as needed over time. If BP remained above goal, would add Chlorthalidone . Given elevated blood pressure by review of MyChart messages, she was ultimately started on Chlorthalidone  12.5 mg daily with plans to increase after 2 weeks pending BP trend.  In talking with the patient today, she reports overall feeling very well since her last office visit. Reports her lower extremity edema resolved and she has not had to utilize Lasix  in the past few months. Still on potassium supplementation and questions if this can be reduced or stopped. She denies any recent chest pain or dyspnea on exertion. No specific palpitations, orthopnea or PND.  Studies Reviewed:   EKG: EKG is not ordered today.  Echocardiogram: 11/2023 IMPRESSIONS     1. Left ventricular ejection fraction, by estimation, is 60 to 65%. Left  ventricular ejection fraction by 3D volume is 64 %. The left ventricle has  normal function. The left ventricle has no regional wall motion  abnormalities.  Left ventricular diastolic   parameters are consistent with Grade I diastolic dysfunction (impaired  relaxation). The average left ventricular global longitudinal strain is  -20.7 %. The global longitudinal strain is normal.   2. Right ventricular systolic function is normal. The right ventricular  size is normal. There is normal pulmonary artery systolic pressure.   3. The mitral valve is normal in structure. Trivial mitral valve  regurgitation. No evidence of mitral stenosis.   4. The aortic valve is tricuspid. Aortic valve regurgitation is not  visualized. No aortic stenosis is present.   5. The inferior vena cava is normal in size with greater than 50%  respiratory variability, suggesting right atrial pressure of 3 mmHg.   Comparison(s): No prior Echocardiogram.    Physical Exam:   VS:  BP 124/72   Pulse 89   Ht 5' 1.5 (1.562 m)   Wt 169 lb 3.2 oz (76.7 kg)   SpO2 99%   BMI 31.45 kg/m    Wt Readings from Last 3 Encounters:  02/07/24 169 lb 3.2 oz (76.7 kg)  12/13/23 174 lb 12.8 oz (79.3 kg)  10/31/23 179 lb 3.2 oz (81.3 kg)     GEN: Well nourished, well developed female appearing in no acute distress NECK: No JVD; No carotid bruits CARDIAC: RRR, no murmurs, rubs, gallops RESPIRATORY:  Clear to auscultation without rales, wheezing or rhonchi  ABDOMEN: Appears non-distended. No obvious abdominal masses. EXTREMITIES: No clubbing or cyanosis. No pitting edema.  Distal pedal pulses are 2+ bilaterally.   Assessment  and Plan:   1. Essential hypertension - Blood pressure was initially recorded at 136/88 during today's visit but improved to 124/72 on recheck. Continue current medical therapy for now with Chlorthalidone  12.5 mg daily and Losartan  50 mg twice daily. If BP were to increase in the future, would titrate Chlorthalidone  to 25 mg daily.  2. Bilateral lower extremity edema - Symptoms resolved after stopping Amlodipine . Would avoid this in the future. She is no longer  taking Lasix  but is still on K-dur 20 mEq twice daily. Will reduce to 20 mEq daily and recheck a BMET within the next month.   Signed, Laymon CHRISTELLA Qua, PA-C

## 2024-02-06 ENCOUNTER — Encounter (HOSPITAL_COMMUNITY)
Admission: RE | Admit: 2024-02-06 | Discharge: 2024-02-06 | Disposition: A | Discharge status: Home / Self Care | Source: Ambulatory Visit | Attending: Internal Medicine | Admitting: Internal Medicine

## 2024-02-06 NOTE — Pre-Procedure Instructions (Signed)
 Attempted pre-op phonecall. Left VM for her to call us  back.

## 2024-02-07 ENCOUNTER — Encounter: Payer: Self-pay | Admitting: Student

## 2024-02-07 ENCOUNTER — Other Ambulatory Visit (HOSPITAL_BASED_OUTPATIENT_CLINIC_OR_DEPARTMENT_OTHER): Payer: Self-pay

## 2024-02-07 ENCOUNTER — Encounter (HOSPITAL_COMMUNITY): Payer: Self-pay

## 2024-02-07 ENCOUNTER — Ambulatory Visit: Attending: Student | Admitting: Student

## 2024-02-07 ENCOUNTER — Other Ambulatory Visit: Payer: Self-pay

## 2024-02-07 VITALS — BP 124/72 | HR 89 | Ht 61.5 in | Wt 169.2 lb

## 2024-02-07 DIAGNOSIS — I10 Essential (primary) hypertension: Secondary | ICD-10-CM | POA: Diagnosis not present

## 2024-02-07 DIAGNOSIS — R600 Localized edema: Secondary | ICD-10-CM

## 2024-02-07 DIAGNOSIS — Z79899 Other long term (current) drug therapy: Secondary | ICD-10-CM

## 2024-02-07 MED ORDER — LOSARTAN POTASSIUM 50 MG PO TABS
50.0000 mg | ORAL_TABLET | Freq: Two times a day (BID) | ORAL | 3 refills | Status: AC
  Filled 2024-02-07: qty 180, 90d supply, fill #0

## 2024-02-07 MED ORDER — POTASSIUM CHLORIDE CRYS ER 20 MEQ PO TBCR
20.0000 meq | EXTENDED_RELEASE_TABLET | Freq: Every day | ORAL | 3 refills | Status: AC
  Filled 2024-02-07: qty 180, 180d supply, fill #0

## 2024-02-07 NOTE — Patient Instructions (Addendum)
 Medication Instructions:   Continue current medication regimen.  *If you need a refill on your cardiac medications before your next appointment, please call your pharmacy*  Lab Work:  BMET within the next month.   If you have labs (blood work) drawn today and your tests are completely normal, you will receive your results only by: MyChart Message (if you have MyChart) OR A paper copy in the mail If you have any lab test that is abnormal or we need to change your treatment, we will call you to review the results.   Follow-Up: At [facility], you and your health needs are our priority.  As part of our continuing mission to provide you with exceptional heart care, our providers are all part of one team.  This team includes your primary Cardiologist (physician) and Advanced Practice Providers or APPs (Physician Assistants and Nurse Practitioners) who all work together to provide you with the care you need, when you need it.  Your next appointment:   6 month(s)  Provider:   You may see Stanly DELENA Leavens, MD or one of the following Advanced Practice Providers on your designated Care Team:   Sarissa Dern, PA-C  Scotesia [place], [place] Olivia Pavy, [place]     We recommend signing up for the patient portal called MyChart.  Sign up information is provided on this After Visit Summary.  MyChart is used to connect with patients for Virtual Visits (Telemedicine).  Patients are able to view lab/test results, encounter notes, upcoming appointments, etc.  Non-urgent messages can be sent to your provider as well.   To learn more about what you can do with MyChart, go to [email or web address].

## 2024-02-08 ENCOUNTER — Encounter: Payer: Self-pay | Admitting: Student

## 2024-02-11 ENCOUNTER — Encounter (HOSPITAL_COMMUNITY): Payer: Self-pay | Admitting: Internal Medicine

## 2024-02-11 ENCOUNTER — Ambulatory Visit (HOSPITAL_COMMUNITY): Admitting: Anesthesiology

## 2024-02-11 ENCOUNTER — Encounter (HOSPITAL_COMMUNITY)
Admission: RE | Disposition: A | Discharge status: Home / Self Care | Payer: Self-pay | Source: Home / Self Care | Attending: Internal Medicine

## 2024-02-11 ENCOUNTER — Ambulatory Visit (HOSPITAL_COMMUNITY)
Admission: RE | Admit: 2024-02-11 | Discharge: 2024-02-11 | Disposition: A | Discharge status: Home / Self Care | Attending: Internal Medicine | Admitting: Internal Medicine

## 2024-02-11 ENCOUNTER — Other Ambulatory Visit: Payer: Self-pay

## 2024-02-11 DIAGNOSIS — K2970 Gastritis, unspecified, without bleeding: Secondary | ICD-10-CM | POA: Diagnosis not present

## 2024-02-11 DIAGNOSIS — K30 Functional dyspepsia: Secondary | ICD-10-CM | POA: Diagnosis not present

## 2024-02-11 DIAGNOSIS — D509 Iron deficiency anemia, unspecified: Secondary | ICD-10-CM | POA: Diagnosis not present

## 2024-02-11 DIAGNOSIS — K3189 Other diseases of stomach and duodenum: Secondary | ICD-10-CM | POA: Diagnosis not present

## 2024-02-11 DIAGNOSIS — K2950 Unspecified chronic gastritis without bleeding: Secondary | ICD-10-CM | POA: Diagnosis not present

## 2024-02-11 DIAGNOSIS — I10 Essential (primary) hypertension: Secondary | ICD-10-CM | POA: Diagnosis not present

## 2024-02-11 DIAGNOSIS — K31A19 Gastric intestinal metaplasia without dysplasia, unspecified site: Secondary | ICD-10-CM | POA: Diagnosis not present

## 2024-02-11 DIAGNOSIS — K31A0 Gastric intestinal metaplasia, unspecified: Secondary | ICD-10-CM

## 2024-02-11 HISTORY — PX: ESOPHAGOGASTRODUODENOSCOPY: SHX5428

## 2024-02-11 SURGERY — EGD (ESOPHAGOGASTRODUODENOSCOPY)
Anesthesia: Monitor Anesthesia Care

## 2024-02-11 MED ORDER — PROPOFOL 500 MG/50ML IV EMUL
INTRAVENOUS | Status: DC | PRN
  Administered 2024-02-11: 100 mg via INTRAVENOUS
  Administered 2024-02-11: 200 ug/kg/min via INTRAVENOUS

## 2024-02-11 MED ORDER — LACTATED RINGERS IV SOLN: INTRAVENOUS | Status: DC

## 2024-02-11 MED ORDER — LIDOCAINE HCL (CARDIAC) PF 100 MG/5ML IV SOSY
PREFILLED_SYRINGE | INTRAVENOUS | Status: DC | PRN
  Administered 2024-02-11: 100 mg via INTRAVENOUS

## 2024-02-11 NOTE — Anesthesia Preprocedure Evaluation (Signed)
 Anesthesia Evaluation  Patient identified by MRN, date of birth, ID band Patient awake    Reviewed: Allergy & Precautions, H&P , NPO status , Patient's Chart, lab work & pertinent test results, reviewed documented beta blocker date and time   Airway Mallampati: II  TM Distance: >3 FB Neck ROM: full    Dental no notable dental hx.    Pulmonary neg pulmonary ROS   Pulmonary exam normal breath sounds clear to auscultation       Cardiovascular Exercise Tolerance: Good hypertension, negative cardio ROS  Rhythm:regular Rate:Normal     Neuro/Psych negative neurological ROS  negative psych ROS   GI/Hepatic negative GI ROS, Neg liver ROS,,,  Endo/Other  negative endocrine ROS    Renal/GU negative Renal ROS  negative genitourinary   Musculoskeletal   Abdominal   Peds  Hematology negative hematology ROS (+)   Anesthesia Other Findings   Reproductive/Obstetrics negative OB ROS                              Anesthesia Physical Anesthesia Plan  ASA: 2  Anesthesia Plan: MAC   Post-op Pain Management:    Induction:   PONV Risk Score and Plan: Propofol  infusion  Airway Management Planned:   Additional Equipment:   Intra-op Plan:   Post-operative Plan:   Informed Consent: I have reviewed the patients History and Physical, chart, labs and discussed the procedure including the risks, benefits and alternatives for the proposed anesthesia with the patient or authorized representative who has indicated his/her understanding and acceptance.     Dental Advisory Given  Plan Discussed with: CRNA  Anesthesia Plan Comments:         Anesthesia Quick Evaluation

## 2024-02-11 NOTE — H&P (Signed)
 Primary Care Physician:  Dow Longs, PA-C Primary Gastroenterologist:  Dr. Cindie  Pre-Procedure History & Physical: HPI:  Latoya Williams is a 53 y.o. female is here for an EGD to be performed for dyspepsia (ongoing nausea, hiccups), B12 deficiency and IDA   Past Medical History:  Diagnosis Date   Constipation    on Linzess  with soft regular bowel movements    High cholesterol    Hypercholesterolemia    Hypertension     Past Surgical History:  Procedure Laterality Date   ABDOMINAL HYSTERECTOMY     ABDOMINAL SURGERY     BONE MARROW BIOPSY     CHOLECYSTECTOMY     LAPAROSCOPIC OOPHERECTOMY     OOPHORECTOMY      Prior to Admission medications   Medication Sig Start Date End Date Taking? Authorizing Provider  ALPRAZolam  (XANAX ) 0.5 MG tablet Take 1 tablet (0.5 mg total) by mouth daily. 01/09/24  Yes   atorvastatin  (LIPITOR) 20 MG tablet Take 1 tablet (20 mg total) by mouth daily. 08/27/23  Yes   chlorthalidone  (HYGROTON ) 25 MG tablet Take 0.5 tablets (12.5 mg total) by mouth daily. 12/27/23  Yes Strader, Brittany M, PA-C  cyanocobalamin  (VITAMIN B12) 1000 MCG/ML injection Inject 1 mL (1,000 mcg total) into the muscle once a week for 30 days, THEN 1 mL (1,000 mcg total) every 30 (thirty) days. 11/16/23 06/13/24 Yes Shirlean Therisa ORN, NP  estrogens , conjugated, (PREMARIN ) 0.625 MG tablet Take 1 tablet (0.625 mg total) by mouth daily. 12/13/23  Yes   folic acid  (FOLVITE ) 1 MG tablet Take 2 tablets (2 mg total) by mouth daily. 11/16/23 05/14/24 Yes Shirlean Therisa ORN, NP  hydrocortisone  (PROCTOZONE -HC) 2.5 % rectal cream Apply 1 application topically 2 (two) times daily. 08/27/23  Yes   linaclotide  (LINZESS ) 290 MCG CAPS capsule Take 1 capsule (290 mcg total) by mouth daily. 08/27/23  Yes   losartan  (COZAAR ) 50 MG tablet Take 1 tablet (50 mg total) by mouth 2 (two) times daily. 02/07/24 02/01/25 Yes Strader, Laymon HERO, PA-C  ondansetron  (ZOFRAN ) 4 MG tablet Take 1 tablet (4 mg total) by mouth every 8  (eight) hours as needed for nausea. 01/28/24  Yes Shirlean Therisa ORN, NP  potassium chloride  SA (KLOR-CON  M20) 20 MEQ tablet Take 1 tablet (20 mEq total) by mouth daily. 02/07/24  Yes Strader, Brittany M, PA-C  semaglutide -weight management (WEGOVY ) 1 MG/0.5ML SOAJ SQ injection Inject 1 mg into the skin once a week. 01/25/24       Allergies as of 01/29/2024 - Review Complete 12/14/2023  Allergen Reaction Noted   Amlodipine   12/27/2023   Penicillins Hives 02/14/2016   Penicillins  03/17/2018    Family History  Problem Relation Age of Onset   Hypertension Mother    Colon polyps Father    Hypertension Sister    Hypertension Brother    Colon cancer Neg Hx    Esophageal cancer Neg Hx    Rectal cancer Neg Hx    Stomach cancer Neg Hx    Liver disease Neg Hx     Social History   Socioeconomic History   Marital status: Single    Spouse name: Not on file   Number of children: Not on file   Years of education: Not on file   Highest education level: Not on file  Occupational History   Not on file  Tobacco Use   Smoking status: Never   Smokeless tobacco: Never  Vaping Use   Vaping status: Never Used  Substance  and Sexual Activity   Alcohol  use: Never   Drug use: Never   Sexual activity: Yes    Birth control/protection: Surgical  Other Topics Concern   Not on file  Social History Narrative   ** Merged History Encounter **       Social Drivers of Corporate Investment Banker Strain: Not on file  Food Insecurity: Not on file  Transportation Needs: Not on file  Physical Activity: Not on file  Stress: Not on file  Social Connections: Not on file  Intimate Partner Violence: Not on file    Review of Systems: General: Negative for fever, chills, fatigue, weakness. Eyes: Negative for vision changes.  ENT: Negative for hoarseness, difficulty swallowing , nasal congestion. CV: Negative for chest pain, angina, palpitations, dyspnea on exertion, peripheral edema.  Respiratory:  Negative for dyspnea at rest, dyspnea on exertion, cough, sputum, wheezing.  GI: See history of present illness. GU:  Negative for dysuria, hematuria, urinary incontinence, urinary frequency, nocturnal urination.  MS: Negative for joint pain, low back pain.  Derm: Negative for rash or itching.  Neuro: Negative for weakness, abnormal sensation, seizure, frequent headaches, memory loss, confusion.  Psych: Negative for anxiety, depression Endo: Negative for unusual weight change.  Heme: Negative for bruising or bleeding. Allergy: Negative for rash or hives.  Physical Exam: Vital signs in last 24 hours: Temp:  [97.7 F (36.5 C)] 97.7 F (36.5 C) (12/08 1122) Pulse Rate:  [68] 68 (12/08 1122) Resp:  [16] 16 (12/08 1122) BP: (137)/(84) 137/84 (12/08 1122) SpO2:  [100 %] 100 % (12/08 1122) Weight:  [76.7 kg] 76.7 kg (12/08 1122)   General:   Alert,  Well-developed, well-nourished, pleasant and cooperative in NAD Head:  Normocephalic and atraumatic. Eyes:  Sclera clear, no icterus.   Conjunctiva pink. Ears:  Normal auditory acuity. Nose:  No deformity, discharge,  or lesions. Msk:  Symmetrical without gross deformities. Normal posture. Extremities:  Without clubbing or edema. Neurologic:  Alert and  oriented x4;  grossly normal neurologically. Skin:  Intact without significant lesions or rashes. Psych:  Alert and cooperative. Normal mood and affect.   Impression/Plan: Latoya Williams is here for an EGD to be performed for dyspepsia (ongoing nausea, hiccups), B12 deficiency and IDA   Risks, benefits, limitations, imponderables and alternatives regarding procedure have been reviewed with the patient. Questions have been answered. All parties agreeable.

## 2024-02-11 NOTE — Discharge Instructions (Addendum)
 EGD Discharge instructions Please read the instructions outlined below and refer to this sheet in the next few weeks. These discharge instructions provide you with general information on caring for yourself after you leave the hospital. Your doctor may also give you specific instructions. While your treatment has been planned according to the most current medical practices available, unavoidable complications occasionally occur. If you have any problems or questions after discharge, please call your doctor. ACTIVITY You may resume your regular activity but move at a slower pace for the next 24 hours.  Take frequent rest periods for the next 24 hours.  Walking will help expel (get rid of) the air and reduce the bloated feeling in your abdomen.  No driving for 24 hours (because of the anesthesia (medicine) used during the test).  You may shower.  Do not sign any important legal documents or operate any machinery for 24 hours (because of the anesthesia used during the test).  NUTRITION Drink plenty of fluids.  You may resume your normal diet.  Begin with a light meal and progress to your normal diet.  Avoid alcoholic beverages for 24 hours or as instructed by your caregiver.  MEDICATIONS You may resume your normal medications unless your caregiver tells you otherwise.  WHAT YOU CAN EXPECT TODAY You may experience abdominal discomfort such as a feeling of fullness or "gas" pains.  FOLLOW-UP Your doctor will discuss the results of your test with you.  SEEK IMMEDIATE MEDICAL ATTENTION IF ANY OF THE FOLLOWING OCCUR: Excessive nausea (feeling sick to your stomach) and/or vomiting.  Severe abdominal pain and distention (swelling).  Trouble swallowing.  Temperature over 101 F (37.8 C).  Rectal bleeding or vomiting of blood.   Your EGD revealed mild amount inflammation in your stomach.  I took biopsies of this to rule out infection with a bacteria called H. pylori.  Await pathology results, my  office will contact you.  Further recommendations to follow  I hope you have a great rest of your week!  Carlin POUR. Cindie, D.O. Gastroenterology and Hepatology [facility]

## 2024-02-11 NOTE — Op Note (Signed)
 [facility] Patient Name: Latoya Williams Procedure Date: 02/11/2024 11:49 AM MRN: [id number] Date of Birth: 09-Mar-1970 Attending MD: Carlin POUR. Cindie HAS, [id number] CSN: [id number] Age: 53 Admit Type: Outpatient Procedure:                Upper GI endoscopy Indications:              Iron deficiency anemia, Functional Dyspepsia Providers:                Carlin POUR. Cindie, DO, Tammy Vaught, RN, Bascom Blush Referring MD:              Medicines:                See the Anesthesia note for documentation of the                            administered medications Complications:            No immediate complications. Estimated Blood Loss:     Estimated blood loss was minimal. Procedure:                Pre-Anesthesia Assessment:                           - The anesthesia plan was to use monitored                            anesthesia care (MAC).                           After obtaining informed consent, the endoscope was                            passed under direct vision. Throughout the                            procedure, the patient's blood pressure, pulse, and                            oxygen saturations were monitored continuously. The                            [id number] ([id number]) Upper was introduced through                            the mouth, and advanced to the second part of                            duodenum. The upper GI endoscopy was accomplished                            without difficulty. The patient tolerated the                            procedure well. Scope In:  12:02:44 PM Scope Out: 12:06:11 PM Total Procedure Duration: 0 hours 3 minutes 27 seconds  Findings:      The Z-line was regular and was found 36 cm from the incisors.      Patchy mild inflammation characterized by erythema was found in the       entire examined stomach. Biopsies were taken with a cold forceps for       Helicobacter pylori testing.      The duodenal  bulb, first portion of the duodenum and second portion of       the duodenum were normal. Impression:               - Z-line regular, 36 cm from the incisors.                           - Gastritis. Biopsied.                           - Normal duodenal bulb, first portion of the                            duodenum and second portion of the duodenum. Moderate Sedation:      Per Anesthesia Care Recommendation:           - Patient has a contact number available for                            emergencies. The signs and symptoms of potential                            delayed complications were discussed with the                            patient. Return to normal activities tomorrow.                            Written discharge instructions were provided to the                            patient.                           - Resume previous diet.                           - Continue present medications.                           - Await pathology results.                           - Return to GI clinic in 3 months. Procedure Code(s):        --- Professional ---                           [phone], Esophagogastroduodenoscopy, flexible,  transoral; with biopsy, single or multiple Diagnosis Code(s):        --- Professional ---                           K29.70, Gastritis, unspecified, without bleeding                           D50.9, Iron deficiency anemia, unspecified                           K30, Functional dyspepsia CPT copyright 2022 American Medical Association. All rights reserved. The codes documented in this report are preliminary and upon coder review may  be revised to meet current compliance requirements. Carlin POUR. Cindie, DO Carlin POUR. Cindie, DO 02/11/2024 12:10:08 PM This report has been signed electronically. Number of Addenda: 0

## 2024-02-11 NOTE — Transfer of Care (Signed)
 Immediate Anesthesia Transfer of Care Note  Patient: Latoya Williams  Procedure(s) Performed: EGD (ESOPHAGOGASTRODUODENOSCOPY)  Patient Location: Short Stay  Anesthesia Type:General  Level of Consciousness: drowsy, patient cooperative, and responds to stimulation  Airway & Oxygen Therapy: Patient Spontanous Breathing  Post-op Assessment: Report given to RN and Post -op Vital signs reviewed and stable  Post vital signs: Reviewed and stable  Last Vitals:  Vitals Value Taken Time  BP 109/62 02/11/24 12:10  Temp 36.5 C 02/11/24 12:10  Pulse 83 02/11/24 12:10  Resp 16 02/11/24 12:10  SpO2 93 % 02/11/24 12:10    Last Pain:  Vitals:   02/11/24 1210  TempSrc: Oral  PainSc: 0-No pain      Patients Stated Pain Goal: 6 (02/11/24 1210)  Complications: No notable events documented.

## 2024-02-12 ENCOUNTER — Encounter (HOSPITAL_COMMUNITY): Payer: Self-pay | Admitting: Internal Medicine

## 2024-02-12 LAB — SURGICAL PATHOLOGY

## 2024-02-12 NOTE — Anesthesia Postprocedure Evaluation (Signed)
 Anesthesia Post Note  Patient: Latoya Williams  Procedure(s) Performed: EGD (ESOPHAGOGASTRODUODENOSCOPY)  Patient location during evaluation: Phase II Anesthesia Type: MAC Level of consciousness: awake Pain management: pain level controlled Vital Signs Assessment: post-procedure vital signs reviewed and stable Respiratory status: spontaneous breathing and respiratory function stable Cardiovascular status: blood pressure returned to baseline and stable Postop Assessment: no headache and no apparent nausea or vomiting Anesthetic complications: no Comments: Late entry   No notable events documented.   Last Vitals:  Vitals:   02/11/24 1122 02/11/24 1210  BP: 137/84 109/62  Pulse: 68 83  Resp: 16 16  Temp: 36.5 C 36.5 C  SpO2: 100% 93%    Last Pain:  Vitals:   02/11/24 1210  TempSrc: Oral  PainSc: 0-No pain                 Yvonna JINNY Bosworth

## 2024-02-19 ENCOUNTER — Telehealth: Payer: Self-pay | Admitting: Gastroenterology

## 2024-02-19 MED ORDER — SUCRALFATE 1 GM/10ML PO SUSP: 1.0000 g | Freq: Four times a day (QID) | ORAL | 1 refills | Status: AC

## 2024-02-19 MED ORDER — PANTOPRAZOLE SODIUM 40 MG PO TBEC: 40.0000 mg | DELAYED_RELEASE_TABLET | Freq: Two times a day (BID) | ORAL | 3 refills | Status: AC

## 2024-02-19 NOTE — Telephone Encounter (Signed)
 Patient noting dyspepsia. Currently on OTC PPI.   EGD completed in interim from when I last saw in office.   Pathology with reactive gastropathy, mild chronic gastritis and focal intestinal metaplasia. Negative H.pylori.  Will start on pantoprazole  BID, add short course of carafate  due to epigastric discomfort and nausea intermittently.   Timing of EGD for surveillance of intestinal metaplasia TBD.

## 2024-02-20 ENCOUNTER — Other Ambulatory Visit (HOSPITAL_BASED_OUTPATIENT_CLINIC_OR_DEPARTMENT_OTHER): Payer: Self-pay

## 2024-02-21 ENCOUNTER — Other Ambulatory Visit (HOSPITAL_COMMUNITY): Payer: Self-pay

## 2024-02-21 ENCOUNTER — Other Ambulatory Visit (HOSPITAL_BASED_OUTPATIENT_CLINIC_OR_DEPARTMENT_OTHER): Payer: Self-pay

## 2024-02-21 MED ORDER — SEMAGLUTIDE-WEIGHT MANAGEMENT 1 MG/0.5ML ~~LOC~~ SOAJ
1.0000 mg | SUBCUTANEOUS | 0 refills | Status: DC
  Filled 2024-02-21 – 2024-03-13 (×2): qty 2, 28d supply, fill #0

## 2024-02-26 ENCOUNTER — Other Ambulatory Visit (HOSPITAL_BASED_OUTPATIENT_CLINIC_OR_DEPARTMENT_OTHER): Payer: Self-pay

## 2024-03-13 ENCOUNTER — Other Ambulatory Visit (HOSPITAL_BASED_OUTPATIENT_CLINIC_OR_DEPARTMENT_OTHER): Payer: Self-pay

## 2024-03-21 ENCOUNTER — Other Ambulatory Visit (HOSPITAL_BASED_OUTPATIENT_CLINIC_OR_DEPARTMENT_OTHER): Payer: Self-pay

## 2024-04-01 ENCOUNTER — Telehealth: Payer: Self-pay | Admitting: Gastroenterology

## 2024-04-01 DIAGNOSIS — D649 Anemia, unspecified: Secondary | ICD-10-CM

## 2024-04-01 DIAGNOSIS — R748 Abnormal levels of other serum enzymes: Secondary | ICD-10-CM

## 2024-04-01 NOTE — Telephone Encounter (Signed)
 Updating labs to have done for patient.   HFP, immunoglobulins, B12, folate, homocysteine, methylmalonic, CBC with diff, iron studies placed for [facility].

## 2024-04-11 ENCOUNTER — Other Ambulatory Visit (HOSPITAL_BASED_OUTPATIENT_CLINIC_OR_DEPARTMENT_OTHER): Payer: Self-pay

## 2024-04-11 ENCOUNTER — Ambulatory Visit: Payer: Self-pay | Admitting: Student

## 2024-04-11 ENCOUNTER — Other Ambulatory Visit (HOSPITAL_COMMUNITY): Admission: RE | Admit: 2024-04-11 | Source: Ambulatory Visit

## 2024-04-11 DIAGNOSIS — Z79899 Other long term (current) drug therapy: Secondary | ICD-10-CM

## 2024-04-11 LAB — IRON AND TIBC
Iron: 116 ug/dL (ref 28–170)
Saturation Ratios: 31 % (ref 10.4–31.8)
TIBC: 371 ug/dL (ref 250–450)
UIBC: 255 ug/dL

## 2024-04-11 LAB — CBC WITH DIFFERENTIAL/PLATELET
Abs Immature Granulocytes: 0.01 10*3/uL (ref 0.00–0.07)
Basophils Absolute: 0 10*3/uL (ref 0.0–0.1)
Basophils Relative: 1 %
Eosinophils Absolute: 0.1 10*3/uL (ref 0.0–0.5)
Eosinophils Relative: 2 %
HCT: 37.3 % (ref 36.0–46.0)
Hemoglobin: 11.8 g/dL — ABNORMAL LOW (ref 12.0–15.0)
Immature Granulocytes: 0 %
Lymphocytes Relative: 36 %
Lymphs Abs: 2 10*3/uL (ref 0.7–4.0)
MCH: 26 pg (ref 26.0–34.0)
MCHC: 31.6 g/dL (ref 30.0–36.0)
MCV: 82.3 fL (ref 80.0–100.0)
Monocytes Absolute: 0.5 10*3/uL (ref 0.1–1.0)
Monocytes Relative: 9 %
Neutro Abs: 3.1 10*3/uL (ref 1.7–7.7)
Neutrophils Relative %: 52 %
Platelets: 345 10*3/uL (ref 150–400)
RBC: 4.53 MIL/uL (ref 3.87–5.11)
RDW: 14 % (ref 11.5–15.5)
WBC: 5.7 10*3/uL (ref 4.0–10.5)
nRBC: 0 % (ref 0.0–0.2)

## 2024-04-11 LAB — BASIC METABOLIC PANEL WITH GFR
Anion gap: 14 (ref 5–15)
BUN: 10 mg/dL (ref 6–20)
CO2: 23 mmol/L (ref 22–32)
Calcium: 8.7 mg/dL — ABNORMAL LOW (ref 8.9–10.3)
Chloride: 102 mmol/L (ref 98–111)
Creatinine, Ser: 0.82 mg/dL (ref 0.44–1.00)
GFR, Estimated: >60 mL/min
Glucose, Bld: 84 mg/dL (ref 70–99)
Potassium: 3.5 mmol/L (ref 3.5–5.1)
Sodium: 139 mmol/L (ref 135–145)

## 2024-04-11 LAB — HEPATIC FUNCTION PANEL
ALT: 8 U/L (ref 0–44)
AST: 16 U/L (ref 15–41)
Albumin: 4.1 g/dL (ref 3.5–5.0)
Alkaline Phosphatase: 136 U/L — ABNORMAL HIGH (ref 38–126)
Bilirubin, Direct: 0.1 mg/dL (ref 0.0–0.2)
Indirect Bilirubin: 0.2 mg/dL — ABNORMAL LOW (ref 0.3–0.9)
Total Bilirubin: 0.3 mg/dL (ref 0.0–1.2)
Total Protein: 7.9 g/dL (ref 6.5–8.1)

## 2024-04-11 LAB — VITAMIN B12: Vitamin B-12: >4000 pg/mL — ABNORMAL HIGH (ref 180–914)

## 2024-04-11 LAB — FOLATE: Folate: 17.1 ng/mL

## 2024-04-11 LAB — FERRITIN: Ferritin: 23 ng/mL (ref 11–307)

## 2024-04-11 MED ORDER — SEMAGLUTIDE-WEIGHT MANAGEMENT 1 MG/0.5ML ~~LOC~~ SOAJ
1.0000 mg | SUBCUTANEOUS | 0 refills | Status: AC
  Filled 2024-04-11: qty 2, 28d supply, fill #0
